# Patient Record
Sex: Female | Born: 1965 | Race: Black or African American | Hispanic: No | Smoking: Never smoker
Health system: Southern US, Community
[De-identification: ages and names within clinical notes are randomized; demographics above are authoritative.]

## PROBLEM LIST (undated history)

## (undated) DIAGNOSIS — F32A Depression, unspecified: Secondary | ICD-10-CM

## (undated) DIAGNOSIS — R3129 Other microscopic hematuria: Secondary | ICD-10-CM

## (undated) DIAGNOSIS — F329 Major depressive disorder, single episode, unspecified: Secondary | ICD-10-CM

## (undated) DIAGNOSIS — Z3142 Aftercare following sterilization reversal: Secondary | ICD-10-CM

## (undated) DIAGNOSIS — D649 Anemia, unspecified: Secondary | ICD-10-CM

## (undated) DIAGNOSIS — F419 Anxiety disorder, unspecified: Secondary | ICD-10-CM

## (undated) DIAGNOSIS — G43909 Migraine, unspecified, not intractable, without status migrainosus: Secondary | ICD-10-CM

## (undated) HISTORY — PX: TUBAL LIGATION: SHX77

## (undated) HISTORY — PX: BACK SURGERY: SHX140

## (undated) HISTORY — DX: Depression, unspecified: F32.A

## (undated) HISTORY — PX: WISDOM TOOTH EXTRACTION: SHX21

## (undated) HISTORY — PX: COLONOSCOPY: SHX174

## (undated) HISTORY — DX: Anemia, unspecified: D64.9

## (undated) HISTORY — DX: Major depressive disorder, single episode, unspecified: F32.9

## (undated) HISTORY — PX: OTHER SURGICAL HISTORY: SHX169

## (undated) HISTORY — DX: Anxiety disorder, unspecified: F41.9

---
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
## 1988-04-20 HISTORY — PX: TUBAL LIGATION: SHX77

## 2003-10-15 ENCOUNTER — Other Ambulatory Visit: Admission: RE | Admit: 2003-10-15 | Discharge: 2003-10-15 | Payer: Self-pay | Admitting: Family Medicine

## 2003-10-31 ENCOUNTER — Encounter: Admission: RE | Admit: 2003-10-31 | Discharge: 2003-10-31 | Payer: Self-pay | Admitting: Family Medicine

## 2004-03-07 ENCOUNTER — Ambulatory Visit: Payer: Self-pay | Admitting: Family Medicine

## 2004-07-17 ENCOUNTER — Ambulatory Visit: Payer: Self-pay | Admitting: Family Medicine

## 2004-08-08 ENCOUNTER — Ambulatory Visit: Payer: Self-pay | Admitting: Family Medicine

## 2004-08-22 ENCOUNTER — Ambulatory Visit: Payer: Self-pay | Admitting: Family Medicine

## 2004-12-05 ENCOUNTER — Ambulatory Visit: Payer: Self-pay | Admitting: Family Medicine

## 2004-12-09 ENCOUNTER — Other Ambulatory Visit: Admission: RE | Admit: 2004-12-09 | Discharge: 2004-12-09 | Payer: Self-pay | Admitting: Family Medicine

## 2004-12-09 ENCOUNTER — Ambulatory Visit: Payer: Self-pay | Admitting: Family Medicine

## 2004-12-19 ENCOUNTER — Encounter: Admission: RE | Admit: 2004-12-19 | Discharge: 2004-12-19 | Payer: Self-pay | Admitting: Family Medicine

## 2005-01-13 ENCOUNTER — Ambulatory Visit: Payer: Self-pay | Admitting: Family Medicine

## 2005-04-23 ENCOUNTER — Ambulatory Visit: Payer: Self-pay | Admitting: Family Medicine

## 2005-06-09 ENCOUNTER — Ambulatory Visit: Payer: Self-pay | Admitting: Family Medicine

## 2005-08-05 ENCOUNTER — Ambulatory Visit: Payer: Self-pay | Admitting: Family Medicine

## 2005-08-07 ENCOUNTER — Ambulatory Visit: Payer: Self-pay | Admitting: Family Medicine

## 2005-10-27 ENCOUNTER — Ambulatory Visit: Payer: Self-pay | Admitting: Family Medicine

## 2006-01-04 ENCOUNTER — Ambulatory Visit: Payer: Self-pay | Admitting: Family Medicine

## 2006-03-19 ENCOUNTER — Ambulatory Visit: Payer: Self-pay | Admitting: Family Medicine

## 2006-03-31 ENCOUNTER — Ambulatory Visit: Payer: Self-pay | Admitting: Family Medicine

## 2006-03-31 LAB — CONVERTED CEMR LAB
AST: 23 units/L (ref 0–37)
Alkaline Phosphatase: 67 units/L (ref 39–117)
BUN: 13 mg/dL (ref 6–23)
Basophils Absolute: 0 10*3/uL (ref 0.0–0.1)
Basophils Relative: 0.5 % (ref 0.0–1.0)
CO2: 26 meq/L (ref 19–32)
Calcium: 9.4 mg/dL (ref 8.4–10.5)
Chloride: 107 meq/L (ref 96–112)
Chol/HDL Ratio, serum: 3.5
Cholesterol: 175 mg/dL (ref 0–200)
Eosinophil percent: 3.2 % (ref 0.0–5.0)
LDL Cholesterol: 116 mg/dL — ABNORMAL HIGH (ref 0–99)
Lymphocytes Relative: 44.4 % (ref 12.0–46.0)
MCHC: 33.5 g/dL (ref 30.0–36.0)
Monocytes Absolute: 0.3 10*3/uL (ref 0.2–0.7)
Neutrophils Relative %: 45.9 % (ref 43.0–77.0)
Potassium: 3.8 meq/L (ref 3.5–5.1)
RBC: 3.91 M/uL (ref 3.87–5.11)
RDW: 12.4 % (ref 11.5–14.6)
TSH: 1.17 microintl units/mL (ref 0.35–5.50)
Total Protein: 7 g/dL (ref 6.0–8.3)

## 2006-04-20 HISTORY — PX: OTHER SURGICAL HISTORY: SHX169

## 2006-05-03 ENCOUNTER — Encounter (INDEPENDENT_AMBULATORY_CARE_PROVIDER_SITE_OTHER): Payer: Self-pay | Admitting: *Deleted

## 2006-05-03 ENCOUNTER — Ambulatory Visit: Payer: Self-pay | Admitting: Family Medicine

## 2006-05-03 ENCOUNTER — Other Ambulatory Visit: Admission: RE | Admit: 2006-05-03 | Discharge: 2006-05-03 | Payer: Self-pay | Admitting: Family Medicine

## 2006-05-10 LAB — HM MAMMOGRAPHY

## 2006-05-13 ENCOUNTER — Encounter: Admission: RE | Admit: 2006-05-13 | Discharge: 2006-05-13 | Payer: Self-pay | Admitting: Family Medicine

## 2006-06-30 ENCOUNTER — Ambulatory Visit: Payer: Self-pay | Admitting: Family Medicine

## 2006-07-28 ENCOUNTER — Ambulatory Visit: Payer: Self-pay | Admitting: Family Medicine

## 2006-10-14 ENCOUNTER — Ambulatory Visit: Payer: Self-pay | Admitting: Family Medicine

## 2006-12-31 ENCOUNTER — Encounter (INDEPENDENT_AMBULATORY_CARE_PROVIDER_SITE_OTHER): Payer: Self-pay | Admitting: *Deleted

## 2006-12-31 ENCOUNTER — Ambulatory Visit (HOSPITAL_COMMUNITY): Admission: RE | Admit: 2006-12-31 | Discharge: 2006-12-31 | Payer: Self-pay | Admitting: Obstetrics and Gynecology

## 2007-02-25 IMAGING — MG MM SCREEN MAMMOGRAM BILATERAL
5 series · 5 of 5 positions shown · non-contrast
Comparison: none

DG SCREEN MAMMOGRAM BILATERAL
Bilateral CC and MLO view(s) were taken.

DIGITAL SCREENING MAMMOGRAM WITH CAD:
There is a  dense fibroglandular pattern.  No masses or malignant type calcifications are 
identified.  Compared with prior studies.

[R CC]
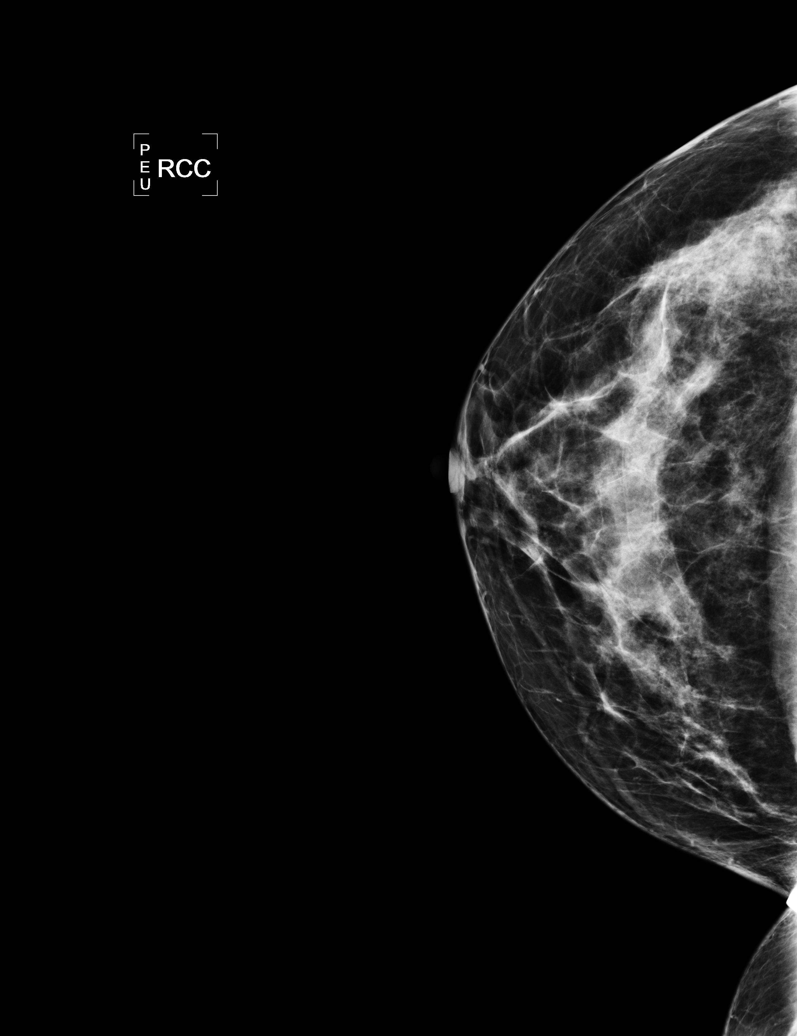

[L CC]
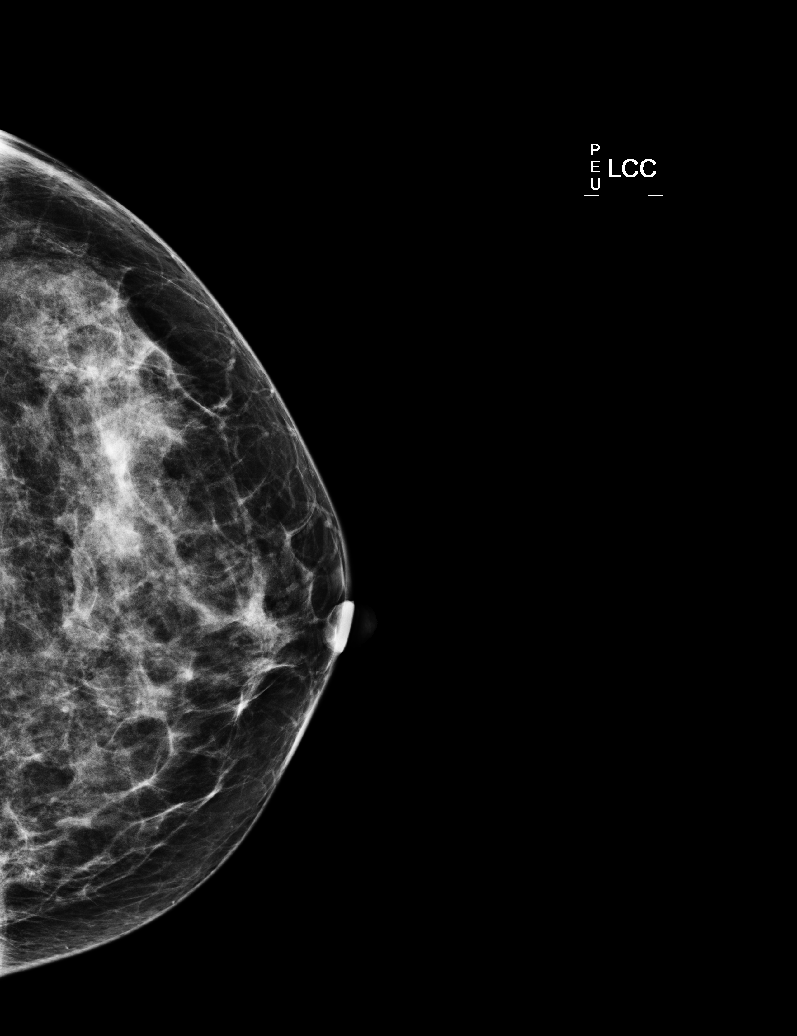

[L MLO (1 of 2)]
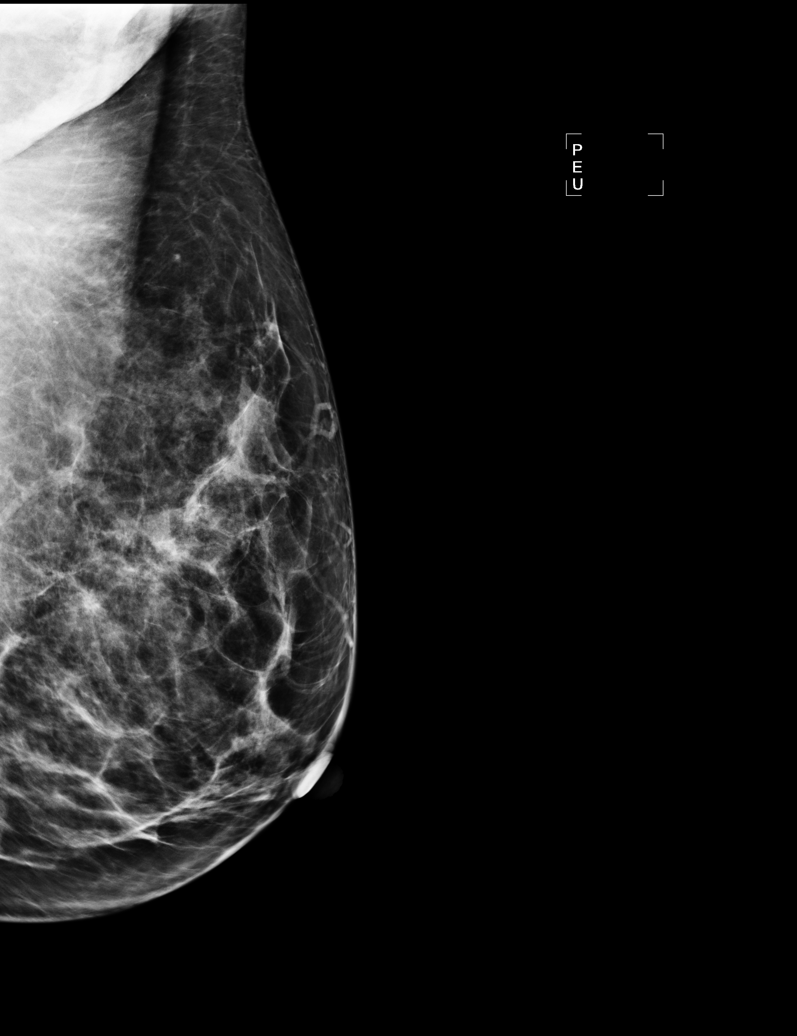

[R MLO]
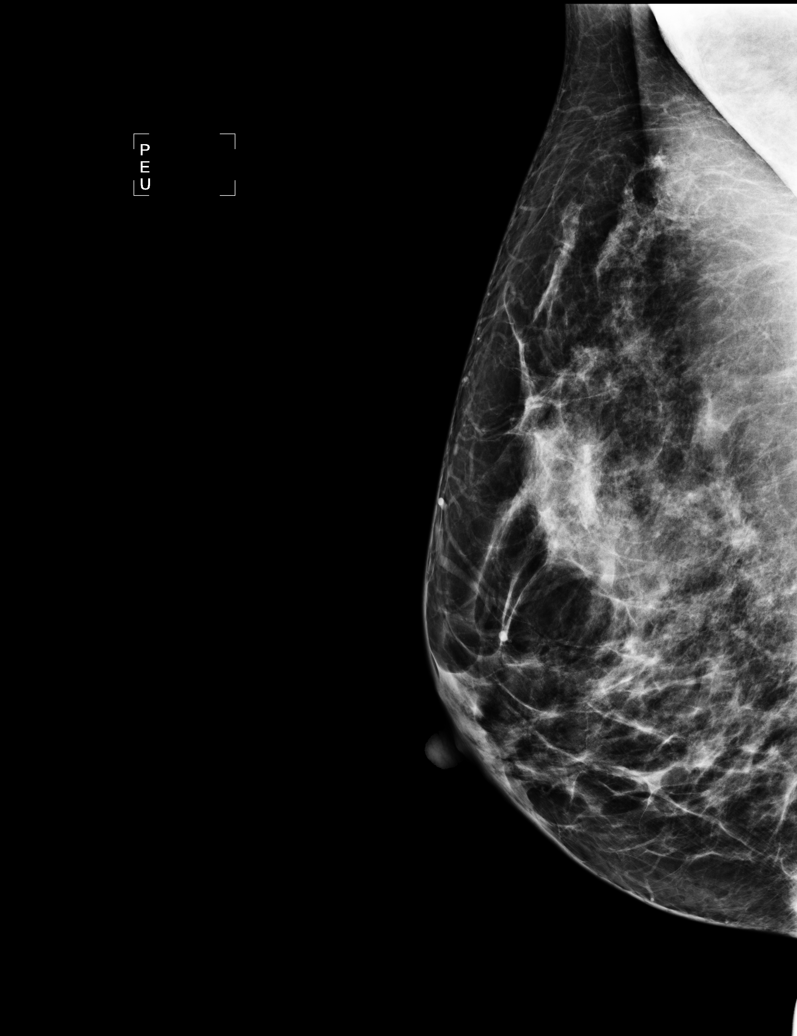

[L MLO (2 of 2)]
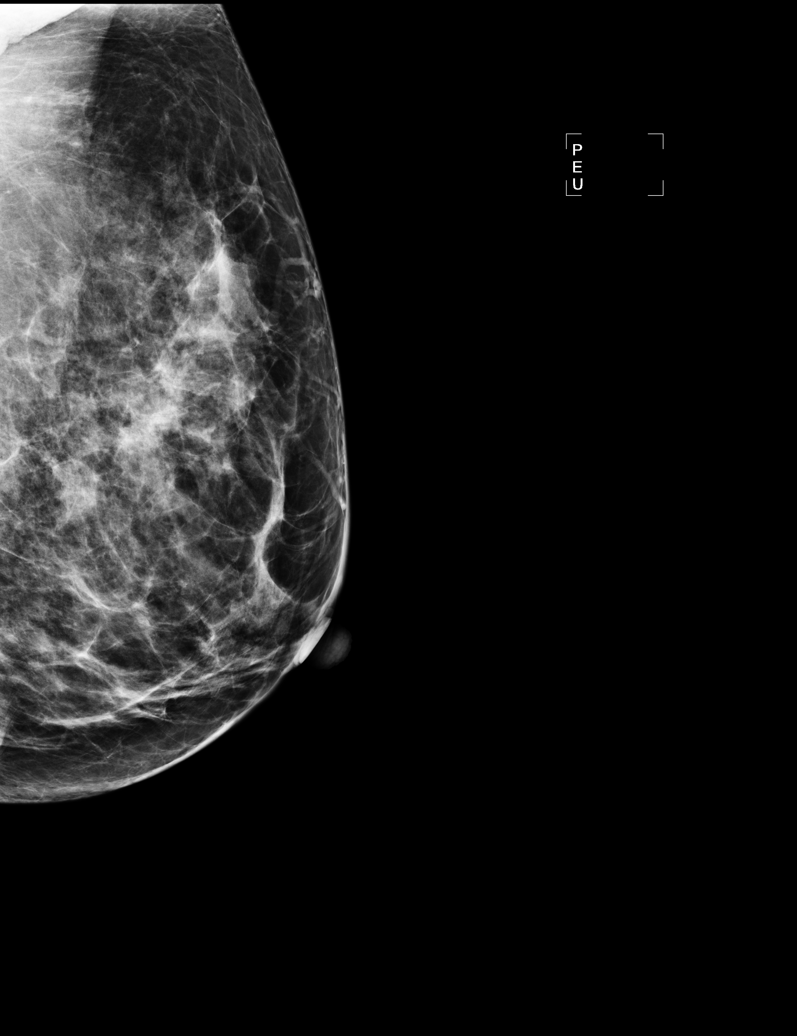

[5 of 5 positions shown; findings below may reference images not displayed]

IMPRESSION: No specific mammographic evidence of malignancy.  Next screening mammogram is recommended in one 
year.

ASSESSMENT: Negative - BI-RADS 1

Screening mammogram in 1 year.
ANALYZED BY COMPUTER AIDED DETECTION. , THIS PROCEDURE WAS A DIGITAL MAMMOGRAM.

## 2007-04-21 HISTORY — PX: OTHER SURGICAL HISTORY: SHX169

## 2007-10-17 ENCOUNTER — Ambulatory Visit (HOSPITAL_COMMUNITY): Admission: RE | Admit: 2007-10-17 | Discharge: 2007-10-17 | Payer: Self-pay | Admitting: Obstetrics and Gynecology

## 2009-03-11 ENCOUNTER — Ambulatory Visit: Payer: Self-pay | Admitting: Family Medicine

## 2009-03-11 DIAGNOSIS — J019 Acute sinusitis, unspecified: Secondary | ICD-10-CM

## 2009-09-05 ENCOUNTER — Ambulatory Visit: Payer: Self-pay | Admitting: Family Medicine

## 2009-09-05 DIAGNOSIS — H04329 Acute dacryocystitis of unspecified lacrimal passage: Secondary | ICD-10-CM | POA: Insufficient documentation

## 2009-12-31 ENCOUNTER — Ambulatory Visit: Payer: Self-pay | Admitting: Family Medicine

## 2009-12-31 DIAGNOSIS — K602 Anal fissure, unspecified: Secondary | ICD-10-CM

## 2010-01-02 ENCOUNTER — Ambulatory Visit: Payer: Self-pay | Admitting: Family Medicine

## 2010-01-02 DIAGNOSIS — R198 Other specified symptoms and signs involving the digestive system and abdomen: Secondary | ICD-10-CM | POA: Insufficient documentation

## 2010-01-08 ENCOUNTER — Telehealth: Payer: Self-pay | Admitting: Family Medicine

## 2010-01-08 ENCOUNTER — Encounter (INDEPENDENT_AMBULATORY_CARE_PROVIDER_SITE_OTHER): Payer: Self-pay | Admitting: *Deleted

## 2010-01-08 DIAGNOSIS — K6289 Other specified diseases of anus and rectum: Secondary | ICD-10-CM

## 2010-01-14 ENCOUNTER — Ambulatory Visit: Payer: Self-pay | Admitting: Internal Medicine

## 2010-01-15 ENCOUNTER — Ambulatory Visit: Payer: Self-pay | Admitting: Internal Medicine

## 2010-05-21 NOTE — Assessment & Plan Note (Signed)
Summary: SHARP RECTAL PAIN SINCE FRIDAY//SLM   Vital Signs:  Patient profile:   45 year old female Weight:      146 pounds O2 Sat:      98 % Temp:     99.2 degrees F Pulse rate:   87 / minute BP sitting:   120 / 80  (left arm) Cuff size:   regular  Vitals Entered By: Pura Spice, RN (December 31, 2009 4:01 PM) CC: strains with BM  pain rectal area has been using tucks and preparation h.    History of Present Illness: Here for several days of sharp pains around the rectum. No bleeding. Today it feels better than it did. She has been applying Preparation H to the area. BMs are regular and not painful.   Allergies (verified): No Known Drug Allergies  Past History:  Past Medical History: Reviewed history from 10/11/2006 and no changes required. Unremarkable  Past Surgical History: Reviewed history from 10/11/2006 and no changes required. Denies surgical history  Review of Systems  The patient denies anorexia, fever, weight loss, weight gain, vision loss, decreased hearing, hoarseness, chest pain, syncope, dyspnea on exertion, peripheral edema, prolonged cough, headaches, hemoptysis, abdominal pain, melena, hematochezia, severe indigestion/heartburn, hematuria, incontinence, genital sores, muscle weakness, suspicious skin lesions, transient blindness, difficulty walking, depression, unusual weight change, abnormal bleeding, enlarged lymph nodes, angioedema, breast masses, and testicular masses.    Physical Exam  General:  Well-developed,well-nourished,in no acute distress; alert,appropriate and cooperative throughout examination Rectal:  clear except for a partially healed tiny fissure on the anterior anal verge, no hemorrhoids   Impression & Recommendations:  Problem # 1:  ANAL FISSURE (ICD-565.0)  Complete Medication List: 1)  Polytrim 10000-0.1 Unit/ml-% Soln (Polymyxin b-trimethoprim) .... 2 drops od q 4 hours while awake  Patient Instructions: 1)  Increase fiber  in the diet.  2)  Please schedule a follow-up appointment as needed .

## 2010-05-21 NOTE — Assessment & Plan Note (Signed)
Summary: SORE BUMP ON R EYE // RS   Vital Signs:  Patient profile:   45 year old female Weight:      148 pounds Temp:     98.5 degrees F oral BP sitting:   90 / 62  (left arm) Cuff size:   regular  Vitals Entered By: Sid Falcon LPN (Sep 05, 2009 2:56 PM) CC: bump on right eye, sore   History of Present Illness: Noted swelling right upper lid onset this morning. She noticed some swelling along the inner canthus region. Minimal drainage. No blurred vision. No eye injury. Left eye is normal.  No fever.  Allergies (verified): No Known Drug Allergies  Review of Systems  The patient denies fever, vision loss, and headaches.    Physical Exam  General:  Well-developed,well-nourished,in no acute distress; alert,appropriate and cooperative throughout examination Head:  Normocephalic and atraumatic without obvious abnormalities. No apparent alopecia or balding. Eyes:  patient has minimal swelling left upper lid medially. No visible erythema. Minimal thick mucoid drainage near the inner canthus region. Conjunctiva appears normal. Pupils equal reactive to light. Cornea normal Ears:  External ear exam shows no significant lesions or deformities.  Otoscopic examination reveals clear canals, tympanic membranes are intact bilaterally without bulging, retraction, inflammation or discharge. Hearing is grossly normal bilaterally.   Impression & Recommendations:  Problem # 1:  ACUTE DACRYOCYSTITIS (ICD-375.32) Assessment New warm compresses and antibiotic eye drop and ophthalmology next week if no better.  Complete Medication List: 1)  Polytrim 10000-0.1 Unit/ml-% Soln (Polymyxin b-trimethoprim) .... 2 drops od q 4 hours while awake  Patient Instructions: 1)  use warm compresses several times daily 2)  Use topical antibiotic every 4 hours while awake 3)  Followup in 4-5 days if symptoms not resolving Prescriptions: POLYTRIM 10000-0.1 UNIT/ML-% SOLN (POLYMYXIN B-TRIMETHOPRIM) 2 drops OD q  4 hours while awake  #5 ml x 1   Entered and Authorized by:   Evelena Peat MD   Signed by:   Evelena Peat MD on 09/05/2009   Method used:   [facility] to        [facility] [phone]* (retail)       [address]       [place], [place]  [address]       Ph: [phone]       Fax: [phone]   RxID:   [id number]

## 2010-05-21 NOTE — Assessment & Plan Note (Addendum)
Summary: Severe rectal pain (new consult)   History of Present Illness Visit Type: Initial Consult Primary GI MD: Yancey Flemings MD Primary Provider: Gershon Crane, MD Requesting Provider: Gershon Crane, MD Chief Complaint: severe chronic rectal pain, no bleeding History of Present Illness:   45 year old African American female with no significant past medical history. She is referred here today regarding new onset rectal pain of about 3 weeks duration. Patient reports being in her usual state of good health until about 3 weeks ago when she noticed a sharp pain in the rectal area that lasted for a few seconds. This happened Elnita Maxwell times per day, and was most noticeable upon initially sitting or standing. She was seen by Dr. Clent Ridges on September 13 and rectal exam revealed a healing fissure. She was treated with topical therapy. Followup in 2 days revealed a normal rectal exam. Stool was Hemoccult negative. She was given Canasa suppositories 3 times daily. Despite the use, her problem has persisted. Reports that there was no improvement with fiber, preparation H., Tucks pads, or topical therapies/suppositories as noted. The pattern continues to be unchanged. Sharp pain lasts for a few seconds are minute. This occurs about 3 times per day. Biopsies are described as regular without blood. No family history of colon cancer. No new abdominal pain, weight loss. She does have a history of chronic intermittent pelvic pain for which she sees her gynecologist,.   GI Review of Systems    Reports abdominal pain.     Location of  Abdominal pain: lower abdomen/pelvis.    Denies acid reflux, belching, bloating, chest pain, dysphagia with liquids, dysphagia with solids, heartburn, loss of appetite, nausea, vomiting, vomiting blood, weight loss, and  weight gain.      Reports rectal pain.     Denies anal fissure, black tarry stools, change in bowel habit, constipation, diarrhea, diverticulosis, fecal incontinence, heme  positive stool, hemorrhoids, irritable bowel syndrome, jaundice, light color stool, liver problems, and  rectal bleeding.    Current Medications (verified): 1)  Polytrim 10000-0.1 Unit/ml-% Soln (Polymyxin B-Trimethoprim) .... 2 Drops Od Q 4 Hours While Awake  Allergies (verified): No Known Drug Allergies  Past History:  Past Medical History: Reviewed history from 01/02/2010 and no changes required. Unremarkable sees Dr. Marcelle Overlie for GYN exams  Past Surgical History: Denies surgical history Tubal Ligation Reversed tubal ligation 2009  Family History: Reviewed history from 03/11/2009 and no changes required. Family History Diabetes 1st degree relative  Social History: Divorced Never Smoked Alcohol use-no Drug use-no Regular exercise-no Occupation: [employer] Daily Caffeine Use  Review of Systems       The patient complains of blood in urine.  The patient denies allergy/sinus, anemia, anxiety-new, arthritis/joint pain, back pain, breast changes/lumps, change in vision, confusion, cough, coughing up blood, depression-new, fainting, fatigue, fever, headaches-new, hearing problems, heart murmur, heart rhythm changes, itching, menstrual pain, muscle pains/cramps, night sweats, nosebleeds, pregnancy symptoms, shortness of breath, skin rash, sleeping problems, sore throat, swelling of feet/legs, swollen lymph glands, thirst - excessive , urination - excessive , urination changes/pain, urine leakage, vision changes, and voice change.    Vital Signs:  Patient profile:   45 year old female Height:      63 inches Weight:      144.13 pounds BMI:     25.62 Pulse rate:   60 / minute Pulse rhythm:   regular BP sitting:   100 / 66  (left arm) Cuff size:   regular  Vitals Entered By: June McMurray  CMA Duncan Dull) (January 14, 2010 10:54 AM)  Physical Exam  General:  Well developed, well nourished, no acute distress. Head:  Normocephalic and atraumatic. Eyes:  PERRLA, no  icterus. Mouth:  No deformity or lesions, dentition normal. Neck:  Supple; no masses or thyromegaly. Lungs:  Clear throughout to auscultation. Heart:  Regular rate and rhythm; no murmurs, rubs,  or bruits. Abdomen:  Soft, nontender and nondistended. No masses, hepatosplenomegaly or hernias noted. Normal bowel sounds. Rectal:  not repeated. 2 prior rectal exams within the past 2 weeks has noted. Most recent normal Msk:  Symmetrical with no gross deformities. Normal posture. Pulses:  Normal pulses noted. Extremities:  No clubbing, cyanosis, edema or deformities noted. Neurologic:  Alert and  oriented x4;  grossly normal neurologically. Skin:  Intact without significant lesions or rashes. Psych:  Alert and cooperative. Normal mood and affect.   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 I explained to her that this is probably on the basis of spasm and at the condition is poorly understood. Recommend avoiding situations that aggravate symptoms as well as local massage were warm compresses needed. Also plan to rule out other organic processes the may stimulate the condition. See below.  Problem # 2:  SPECIAL SCREENING FOR MALIGNANT NEOPLASMS COLON (ICD-V76.51) appropriate age for index screening colonoscopy. In addition to providing colorectal neoplasia screening, we can rule out other organic processes to explain rectal discomfort. The nature of colonoscopy as well as the risks, benefits, and alternatives were reviewed. She understood and agreed to proceed. Movi prep prescribed. The patient instructed on its use  Other Orders: Colonoscopy (Colon)  Patient Instructions: 1)  Colonoscopy and Flexible Sigmoidoscopy brochure given.  2)  Pick up your prep from your pharmacy.  3)  Copy sent to : Gershon Crane, MD; Marcelle Overlie, M.D. 4)  The medication list was reviewed and reconciled.   All changed / newly prescribed medications were explained.  A complete medication list was provided to the patient / caregiver. Prescriptions: MOVIPREP 100 GM  SOLR (PEG-KCL-NACL-NASULF-NA ASC-C) As per prep instructions.  #1 x 0   Entered by:   Christie Nottingham CMA (AAMA)   Authorized by:   Hilarie Fredrickson MD   Signed by:   Christie Nottingham CMA (AAMA) on 01/14/2010   Method used:   [facility] to        [facility] [phone]* (retail)       [address]       [place], [place]  [address]       Ph: [phone]       Fax: [phone]   RxID:   [id number] MOVIPREP 100 GM  SOLR (PEG-KCL-NACL-NASULF-NA ASC-C) As per prep instructions.  #1 x 0   Entered by:   Christie Nottingham CMA (AAMA)   Authorized by:   Hilarie Fredrickson MD   Signed by:   Christie Nottingham CMA (AAMA) on 01/14/2010   Method used:   Electronically to        [facility] [phone]* (retail)       [address]       [place], [place]  [address]       Ph: [phone]       Fax: [phone]   RxID:   [id number]

## 2010-05-21 NOTE — Letter (Signed)
Summary: New Patient letter  [facility]  [address] [place], [place] [address]   Phone: [phone]  Fax: [phone]       01/08/2010 MRN: [id number]  [facility] [address] [place], [place]  [address]  Dear Ms. Blanchette,  Welcome to the Gastroenterology Division at [facility].    You are scheduled to see Dr.  Yancey Flemings on January 14, 2010 at 11:00am on the 3rd floor at [facility], [address]  We ask that you try to arrive at our office 15 minutes prior to your appointment time to allow for check-in.  We would like you to complete the enclosed self-administered evaluation form prior to your visit and bring it with you on the day of your appointment.  We will review it with you.  Also, please bring a complete list of all your medications or, if you prefer, bring the medication bottles and we will list them.  Please bring your insurance card so that we may make a copy of it.  If your insurance requires a referral to see a specialist, please bring your referral form from your primary care physician.  Co-payments are due at the time of your visit and may be paid by cash, check or credit card.     Your office visit will consist of a consult with your physician (includes a physical exam), any laboratory testing he/she may order, scheduling of any necessary diagnostic testing (e.g. x-ray, ultrasound, CT-scan), and scheduling of a procedure (e.g. Endoscopy, Colonoscopy) if required.  Please allow enough time on your schedule to allow for any/all of these possibilities.    If you cannot keep your appointment, please call [phone] to cancel or reschedule prior to your appointment date.  This allows [place] the opportunity to schedule an appointment for another patient in need of care.  If you do not cancel or reschedule by 5 p.m. the business day prior to your appointment date, you will be charged a $50.00 late cancellation/no-show fee.    Thank you for  choosing [facility] for your medical needs.  We appreciate the opportunity to care for you.  Please visit [place] at our website  to learn more about our practice.                     Sincerely,                                                             The Gastroenterology Division

## 2010-05-21 NOTE — Assessment & Plan Note (Signed)
Summary: F/U ON RECTAL PAIN, ANAL FISSURE // RS   Vital Signs:  Patient profile:   45 year old female Pulse rate:   80 / minute BP sitting:   116 / 78  Vitals Entered By: Pura Spice, RN (January 02, 2010 10:12 AM) CC: still feels like pressure in rectal area. thinks may need referring out.    History of Present Illness: Here for a recheck on intermittent sharp rectal pains that she has been having for about one week. Her BMs are normal, and it is not painful to pass a stool. No bleeding has been seen. No urinary symptoms. No fever or nausea. On our exam 2 days ago we saw what appeared to be a small healing anal fissure, but her current symptoms seem to be far beyond what we would expect from a simple fissure.   Allergies (verified): No Known Drug Allergies  Past History:  Past Medical History: Unremarkable sees Dr. Marcelle Overlie for GYN exams  Past Surgical History: Reviewed history from 10/11/2006 and no changes required. Denies surgical history  Review of Systems  The patient denies anorexia, fever, weight loss, weight gain, vision loss, decreased hearing, hoarseness, chest pain, syncope, dyspnea on exertion, peripheral edema, prolonged cough, headaches, hemoptysis, abdominal pain, melena, hematochezia, severe indigestion/heartburn, hematuria, incontinence, genital sores, muscle weakness, suspicious skin lesions, transient blindness, difficulty walking, depression, unusual weight change, abnormal bleeding, enlarged lymph nodes, angioedema, breast masses, and testicular masses.    Physical Exam  General:  Well-developed,well-nourished,in no acute distress; alert,appropriate and cooperative throughout examination Abdomen:  Bowel sounds positive,abdomen soft and non-tender without masses, organomegaly or hernias noted. Rectal:  No external abnormalities noted. Normal sphincter tone. No rectal masses or tenderness. Stool heme negative   Impression &  Recommendations:  Problem # 1:  TENESMUS (ICD-787.99)  Problem # 2:  ANAL FISSURE (ICD-565.0)  Complete Medication List: 1)  Polytrim 10000-0.1 Unit/ml-% Soln (Polymyxin b-trimethoprim) .... 2 drops od q 4 hours while awake 2)  Canasa 1000 Mg Supp (Mesalamine) .... Use three times a day per rectum as needed  Patient Instructions: 1)  She is experiencing tenesmus, possibly stemming from the recent fissure that has now healed. Try Canasa suppositories and hot tub soaks.  2)  Please schedule a follow-up appointment as needed .  Prescriptions: CANASA 1000 MG SUPP (MESALAMINE) use three times a day per rectum as needed  #30 x 2   Entered and Authorized by:   Nelwyn Salisbury MD   Signed by:   Nelwyn Salisbury MD on 01/02/2010   Method used:   Electronically to        [facility] [phone]* (retail)       [address]       [place], [place]  [address]       Ph: [phone]       Fax: [phone]   RxID:   [id number]

## 2010-05-21 NOTE — Procedures (Signed)
Summary: Colonoscopy  Patient: [name] Note: All result statuses are Final unless otherwise noted.  Tests: (1) Colonoscopy (COL)   COL Colonoscopy           DONE     [facility]     [address]     [place], [place]  [address]           COLONOSCOPY PROCEDURE REPORT           PATIENT:  Linda Mcconnell, Linda Mcconnell  MR#:  [id number]     BIRTHDATE:  1965/08/31, 44 yrs. old  GENDER:  female     ENDOSCOPIST:  Wilhemina Bonito. Eda Keys, MD     REF. BY:  Tera Mater. Clent Ridges, M.D.     PROCEDURE DATE:  01/15/2010     PROCEDURE:  Average-risk screening colonoscopy     G0121     ASA CLASS:  Class I     INDICATIONS:  screening ; rectal pain     MEDICATIONS:   Fentanyl 75 mcg IV, Versed 8 mg IV           DESCRIPTION OF PROCEDURE:   After the risks benefits and     alternatives of the procedure were thoroughly explained, informed     consent was obtained.  Digital rectal exam was performed and     revealed no abnormalities.   The LB [id number] endoscope     was introduced through the anus and advanced to the cecum, which     was identified by both the appendix and ileocecal valve, without     limitations.Time to cecum = 2:55 min. The quality of the prep was     excellent, using MoviPrep.  The instrument was then slowly     withdrawn (time = 9:23 min) as the colon was fully examined.     <<PROCEDUREIMAGES>>           FINDINGS:  A normal appearing cecum, ileocecal valve, and     appendiceal orifice were identified. The ascending, hepatic     flexure, transverse, splenic flexure, descending, sigmoid colon,     and rectum appeared unremarkable.     Retroflexed views in the     rectum revealed no abnormalities.    The scope was then withdrawn     from the patient and the procedure completed.           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           ______________________________     Wilhemina Bonito. Eda Keys, MD           CC:  Nelwyn Salisbury, MD; The Patient           n.     eSIGNED:   Wilhemina Bonito. Eda Keys at 01/15/2010 01:19 PM           Rhoderick Moody, [phone]  Note: An exclamation mark (!) indicates a result that was not dispersed into the flowsheet. Document Creation Date: 01/15/2010 1:20 PM _______________________________________________________________________  (1) Order result status: Final Collection or observation date-time: 01/15/2010 13:10 Requested date-time:  Receipt date-time:  Reported date-time:  Referring Physician:   Ordering Physician: Fransico Setters [phone]) Specimen Source:  Source: Launa Grill Order Number: [id number] Lab site:   Appended Document: Colonoscopy    Clinical Lists Changes  Observations: Added new observation of COLONNXTDUE: 12/2019 (01/15/2010 14:32)

## 2010-05-21 NOTE — Progress Notes (Signed)
Summary: referral  Phone Note Call from Patient Call back at Home Phone [phone]   Caller: vm Summary of Call: Still having chronic, sharp pain rectum on the Rx given.  Need referral to specialist as you mentioned & need it urgently. Initial call taken by: Rudy Jew, RN,  January 08, 2010 12:11 PM  Follow-up for Phone Call        refer to GI ASAP for severe rectal pain Follow-up by: Nelwyn Salisbury MD,  January 08, 2010 1:03 PM  Additional Follow-up for Phone Call Additional follow up Details #1::        sent to terri Additional Follow-up by: Pura Spice, RN,  January 08, 2010 1:45 PM  New Problems: RECTAL PAIN [phone])   New Problems: RECTAL PAIN [phone])

## 2010-05-21 NOTE — Letter (Signed)
Summary: [facility] Instructions  [facility]  [address] [place], [place] [address]   Phone: [phone]  Fax: [phone]       Linda Mcconnell    1965-07-12    MRN: [id number]        Procedure Day Dorna Bloom: Wedneday September 28th, 2011     Arrival Time: 10:30am     Procedure Time: 11:30am     Location of Procedure:                    _x _  [facility] (4th Floor)                        PREPARATION FOR COLONOSCOPY WITH MOVIPREP    THE DAY BEFORE YOUR PROCEDURE         DATE: 01/14/10  DAY: Tuesday  1.  Drink clear liquids the entire day-NO SOLID FOOD  2.  Do not drink anything colored red or purple.  Avoid juices with pulp.  No orange juice.  3.  Drink at least 64 oz. (8 glasses) of fluid/clear liquids during the day to prevent dehydration and help the prep work efficiently.  CLEAR LIQUIDS INCLUDE: Water Jello Ice Popsicles Tea (sugar ok, no milk/cream) Powdered fruit flavored drinks Coffee (sugar ok, no milk/cream) Gatorade Juice: apple, white grape, white cranberry  Lemonade Clear bullion, consomm, broth Carbonated beverages (any kind) Strained chicken noodle soup Hard Candy                             4.  In the morning, mix first dose of MoviPrep solution:    Empty 1 Pouch A and 1 Pouch B into the disposable container    Add lukewarm drinking water to the top line of the container. Mix to dissolve    Refrigerate (mixed solution should be used within 24 hrs)  5.  Begin drinking the prep at 5:00 p.m. The MoviPrep container is divided by 4 marks.   Every 15 minutes drink the solution down to the next mark (approximately 8 oz) until the full liter is complete.   6.  Follow completed prep with 16 oz of clear liquid of your choice (Nothing red or purple).  Continue to drink clear liquids until bedtime.  7.  Before going to bed, mix second dose of MoviPrep solution:    Empty 1 Pouch A and 1 Pouch B into the disposable  container    Add lukewarm drinking water to the top line of the container. Mix to dissolve    Refrigerate  THE DAY OF YOUR PROCEDURE      DATE: 01/15/10 DAY: Wednesday  Beginning at 6:30 a.m. (5 hours before procedure):         1. Every 15 minutes, drink the solution down to the next mark (approx 8 oz) until the full liter is complete.  2. Follow completed prep with 16 oz. of clear liquid of your choice.    3. You may drink clear liquids until 9:30am (2 HOURS BEFORE PROCEDURE).   MEDICATION INSTRUCTIONS  Unless otherwise instructed, you should take regular prescription medications with a small sip of water   as early as possible the morning of your procedure.       OTHER INSTRUCTIONS  You will need a responsible adult at least 45 years of age to accompany you and drive you home.   This person must remain  in the waiting room during your procedure.  Wear loose fitting clothing that is easily removed.  Leave jewelry and other valuables at home.  However, you may wish to bring a book to read or  an iPod/MP3 player to listen to music as you wait for your procedure to start.  Remove all body piercing jewelry and leave at home.  Total time from sign-in until discharge is approximately 2-3 hours.  You should go home directly after your procedure and rest.  You can resume normal activities the  day after your procedure.  The day of your procedure you should not:   Drive   Make legal decisions   Operate machinery   Drink alcohol   Return to work  You will receive specific instructions about eating, activities and medications before you leave.    The above instructions have been reviewed and explained to me by   Marchelle Folks.     I fully understand and can verbalize these instructions _____________________________ Date _________

## 2010-07-02 ENCOUNTER — Encounter: Payer: Self-pay | Admitting: Family Medicine

## 2010-07-02 ENCOUNTER — Ambulatory Visit (INDEPENDENT_AMBULATORY_CARE_PROVIDER_SITE_OTHER): Payer: 59 | Admitting: Family Medicine

## 2010-07-02 VITALS — BP 110/78 | HR 56 | Temp 98.6°F | Wt 149.0 lb

## 2010-07-02 DIAGNOSIS — M25549 Pain in joints of unspecified hand: Secondary | ICD-10-CM

## 2010-07-02 DIAGNOSIS — L0390 Cellulitis, unspecified: Secondary | ICD-10-CM

## 2010-07-02 DIAGNOSIS — M7989 Other specified soft tissue disorders: Secondary | ICD-10-CM

## 2010-07-02 MED ORDER — DOXYCYCLINE HYCLATE 100 MG PO CAPS: 100.0000 mg | ORAL_CAPSULE | Freq: Two times a day (BID) | ORAL | Status: AC

## 2010-07-02 NOTE — Progress Notes (Signed)
  Subjective:    Patient ID: Linda Mcconnell, female    DOB: 1966-03-16, 45 y.o.   MRN: [id number]  HPI Here for 10 days of swelling and pain in the right 3rd finger. She thinks she was bitten by a spider while working in her yard, since the finger started bothering her later that day. She has taken Motrin which helps. The finger was slowly getting better over the past week, but yesterday it started to swell again. No fever or nausea.    Review of Systems  Constitutional: Negative.   Musculoskeletal: Positive for joint swelling.  Skin: Positive for color change.       Objective:   Physical Exam  Constitutional: She appears well-developed and well-nourished.  Musculoskeletal:       The proximal third of the right 3rd finger is red, warm, tender, and mildly tender. Full ROM.           Assessment & Plan:  This is consistent with an arthropod bite which has gotten infected. Use Doxycycline , ice packs, and Motrin.

## 2010-09-05 NOTE — Assessment & Plan Note (Signed)
[facility] OFFICE NOTE   Linda Mcconnell, Linda Mcconnell                      MRN:          [id number]  DATE:05/03/2006                            DOB:          Oct 21, 1965    SUBJECTIVE:  This is a 45 year old woman here for a complete physical  examination.  In general she is doing reasonably well.  We have been  following her for the last several years for irregular period, heavy  periods and painful periods.  Actually from a pain perspective, they  have gotten much more manageable over the past six months or so,  although they are still somewhat irregular.  There seems to be less  bleeding involved.  She still denies any hot flashes.  She has not had a  mammogram now in several years.  She has no other particular complaints  today.   PAST MEDICAL HISTORY/FAMILY HISTORY/HABITS:  For details, I refer you to  our last physical note dated December 09, 2004.  Of note, we did a workup  for some elevated liver enzymes last year, which was negative.  This  included testing for hepatitis, HIV, etc.  All of this was fine.  We  even got an abdominal ultrasound, which was within normal limits.  On  asking her further questions about this at that time, this was during a  time when her menses were particularly painful, and she admits to taking  quite large amounts of Tylenol on a regular basis, to deal with them.  She stopped this some time ago, and in fact never takes anything for her  periods now.  She simply lies down and tries to sleep it off.  She does  not use alcohol and never has.   ALLERGIES:  No known drug allergies.   CURRENT MEDICATIONS:  None.   OBJECTIVE:  VITAL SIGNS:  Height 5 feet 4 inches, weight 144 pounds,  blood pressure 104/72, pulse 84 and regular.  GENERAL:  She appears to be healthy.  SKIN:  Clear.  HEENT:  Eyes clear.  Ears clear.  Pharynx clear.  NECK:  Supple, without lymphadenopathy or masses.  LUNGS:  Clear.  HEART:  Rate and rhythm regular, without gallops, murmurs or rubs.  Distal pulses are full.  ABDOMEN:  Soft, normal bowel sounds, nontender, no masses.  BREASTS/AXILLAE:  Clear.  PELVIC:  External genitalia within normal limits.  Vagina clear.  Cervix  clear.  Pap smear is obtained.  Uterus is not enlarged.  No masses or  tenderness.  EXTREMITIES:  No clubbing, cyanosis or edema.  NEUROLOGIC:  Grossly intact.   LABORATORY DATA:  She was here for fasting labs on March 31, 2006.  These were all within normal limits, including completely normal liver  enzymes.   ASSESSMENT/PLAN:  1. Complete physical:  I advised her to get more regular exercise.      She will also call and set up her own mammogram sometime soon.  2. Menorrhagia and dysmenorrhea, currently fairly well-controlled:      Will follow up as needed.  3. History of elevated  liver enzymes one year ago:  This was probably      due to heavy Tylenol use at the time.  They now are within normal      range, and will continue to follow them closely.     Tera Mater. Clent Ridges, MD  Electronically Signed    SAF/MedQ  DD: 05/03/2006  DT: 05/04/2006  Job #: [id number]

## 2010-10-17 ENCOUNTER — Ambulatory Visit (INDEPENDENT_AMBULATORY_CARE_PROVIDER_SITE_OTHER): Payer: 59 | Admitting: Family Medicine

## 2010-10-17 ENCOUNTER — Encounter: Payer: Self-pay | Admitting: Family Medicine

## 2010-10-17 VITALS — BP 110/76 | Temp 98.6°F | Wt 146.0 lb

## 2010-10-17 DIAGNOSIS — M542 Cervicalgia: Secondary | ICD-10-CM

## 2010-10-17 MED ORDER — HYDROCODONE-ACETAMINOPHEN 10-325 MG PO TABS: 1.0000 | ORAL_TABLET | Freq: Four times a day (QID) | ORAL | Status: AC | PRN

## 2010-10-17 MED ORDER — CYCLOBENZAPRINE HCL 10 MG PO TABS: 10.0000 mg | ORAL_TABLET | Freq: Three times a day (TID) | ORAL | Status: DC | PRN

## 2010-10-17 NOTE — Progress Notes (Signed)
  Subjective:    Patient ID: Linda Mcconnell, female    DOB: January 24, 1966, 45 y.o.   MRN: [id number]  HPI Here for injuries she sustained on 10-14-10 when she fell into a sinkhole in her yard. She twisted her neck and upper body, and now she has stiffness and pain in the back of the neck and into the middle of her back. She has some HAs also. No neurologic deficits. Using heat, Motrin, and some Vicodin she had at home. No pain or numbness in the arms or hands.    Review of Systems  Constitutional: Negative.   HENT: Positive for neck pain and neck stiffness.   Eyes: Negative.   Respiratory: Negative.   Cardiovascular: Negative.   Musculoskeletal: Positive for back pain.  Neurological: Positive for headaches.       Objective:   Physical Exam  Constitutional: She is oriented to person, place, and time. She appears well-developed and well-nourished.  HENT:  Head: Normocephalic and atraumatic.  Neck:       Tender with spasm in the posterior lower neck and the upper thoracic back  area. Very reduced ROM   Neurological: She is alert and oriented to person, place, and time. No cranial nerve deficit.          Assessment & Plan:  Wrote for pain meds and Flexeril for the muscle spasms. Use heat. Will refer for PT also

## 2010-11-19 ENCOUNTER — Ambulatory Visit: Payer: 59 | Attending: Family Medicine

## 2010-11-19 DIAGNOSIS — M542 Cervicalgia: Secondary | ICD-10-CM | POA: Insufficient documentation

## 2010-11-19 DIAGNOSIS — IMO0001 Reserved for inherently not codable concepts without codable children: Secondary | ICD-10-CM | POA: Insufficient documentation

## 2010-11-19 DIAGNOSIS — R51 Headache: Secondary | ICD-10-CM | POA: Insufficient documentation

## 2010-11-19 DIAGNOSIS — R5381 Other malaise: Secondary | ICD-10-CM | POA: Insufficient documentation

## 2010-11-21 ENCOUNTER — Ambulatory Visit: Payer: 59 | Admitting: Physical Therapy

## 2010-11-24 ENCOUNTER — Ambulatory Visit: Payer: 59

## 2010-11-26 ENCOUNTER — Ambulatory Visit: Payer: 59

## 2010-12-01 ENCOUNTER — Ambulatory Visit: Payer: 59

## 2010-12-03 ENCOUNTER — Ambulatory Visit: Payer: 59

## 2010-12-08 ENCOUNTER — Ambulatory Visit: Payer: 59

## 2010-12-10 ENCOUNTER — Encounter: Payer: 59 | Admitting: Physical Therapy

## 2010-12-15 ENCOUNTER — Ambulatory Visit: Payer: 59

## 2010-12-18 ENCOUNTER — Ambulatory Visit: Payer: 59

## 2011-01-20 ENCOUNTER — Telehealth: Payer: Self-pay | Admitting: Family Medicine

## 2011-01-20 NOTE — Telephone Encounter (Signed)
If the PT folks want her to have more therapy, they need to fax me a request so I can sign it to authorize more PT (like we did 4 weeks ago)

## 2011-01-20 NOTE — Telephone Encounter (Signed)
Pt called and said that her referral to Out Pt rehab Ctr has expired. Pt needs to get a new referral for Physical Therapy.

## 2011-01-21 ENCOUNTER — Telehealth: Payer: Self-pay | Admitting: Family Medicine

## 2011-01-21 NOTE — Telephone Encounter (Signed)
Refill request for Hydrocodon-Acetaminophn 10/325 mg take 1 po q6hrs prn. Pt last here on 10/17/10 and script last filled on 11/08/10.

## 2011-01-21 NOTE — Telephone Encounter (Signed)
Need referral for Outpatient Rehab @ [facility] renewed

## 2011-01-22 ENCOUNTER — Ambulatory Visit: Payer: 59 | Attending: Family Medicine

## 2011-01-22 DIAGNOSIS — IMO0001 Reserved for inherently not codable concepts without codable children: Secondary | ICD-10-CM | POA: Insufficient documentation

## 2011-01-22 DIAGNOSIS — R5381 Other malaise: Secondary | ICD-10-CM | POA: Insufficient documentation

## 2011-01-22 DIAGNOSIS — R51 Headache: Secondary | ICD-10-CM | POA: Insufficient documentation

## 2011-01-22 DIAGNOSIS — M542 Cervicalgia: Secondary | ICD-10-CM | POA: Insufficient documentation

## 2011-01-22 MED ORDER — HYDROCODONE-ACETAMINOPHEN 10-325 MG PO TABS: 1.0000 | ORAL_TABLET | Freq: Four times a day (QID) | ORAL | Status: DC | PRN

## 2011-01-22 NOTE — Telephone Encounter (Signed)
Pt came by office and picked up script for PT.

## 2011-01-22 NOTE — Telephone Encounter (Signed)
Script called in

## 2011-01-22 NOTE — Telephone Encounter (Signed)
Call in #[phone] with one rf 

## 2011-01-27 ENCOUNTER — Ambulatory Visit: Payer: 59

## 2011-02-03 ENCOUNTER — Ambulatory Visit: Payer: 59

## 2011-02-06 ENCOUNTER — Encounter: Payer: 59 | Admitting: Physical Therapy

## 2011-02-13 ENCOUNTER — Encounter: Payer: 59 | Admitting: Physical Therapy

## 2011-02-19 ENCOUNTER — Encounter: Payer: 59 | Admitting: Physical Therapy

## 2011-03-02 ENCOUNTER — Ambulatory Visit (INDEPENDENT_AMBULATORY_CARE_PROVIDER_SITE_OTHER): Payer: 59 | Admitting: Family Medicine

## 2011-03-02 ENCOUNTER — Encounter: Payer: Self-pay | Admitting: Family Medicine

## 2011-03-02 VITALS — BP 114/74 | HR 91 | Temp 98.8°F | Wt 147.0 lb

## 2011-03-02 DIAGNOSIS — H6980 Other specified disorders of Eustachian tube, unspecified ear: Secondary | ICD-10-CM

## 2011-03-02 NOTE — Progress Notes (Signed)
  Subjective:    Patient ID: Linda Mcconnell, female    DOB: 1966-02-11, 45 y.o.   MRN: [id number]  HPI Here for several weeks of pressure in the left ear and decreased hearing. No pain.    Review of Systems  Constitutional: Negative.   HENT: Positive for hearing loss. Negative for ear pain, congestion, postnasal drip and sinus pressure.   Eyes: Negative.   Respiratory: Negative.        Objective:   Physical Exam  Constitutional: She appears well-developed and well-nourished.  HENT:  Right Ear: External ear normal.  Left Ear: External ear normal.  Nose: Nose normal.  Mouth/Throat: Oropharynx is clear and moist. No oropharyngeal exudate.  Eyes: Conjunctivae are normal. Pupils are equal, round, and reactive to light.  Neck: No thyromegaly present.  Lymphadenopathy:    She has no cervical adenopathy.          Assessment & Plan:  Try Claritin D prn

## 2011-08-04 ENCOUNTER — Other Ambulatory Visit: Payer: Self-pay | Admitting: Obstetrics and Gynecology

## 2012-06-23 ENCOUNTER — Encounter: Payer: Self-pay | Admitting: Family Medicine

## 2012-06-23 ENCOUNTER — Ambulatory Visit (INDEPENDENT_AMBULATORY_CARE_PROVIDER_SITE_OTHER): Payer: 59 | Admitting: Family Medicine

## 2012-06-23 VITALS — BP 112/72 | HR 90 | Temp 98.3°F | Wt 141.0 lb

## 2012-06-23 DIAGNOSIS — S161XXA Strain of muscle, fascia and tendon at neck level, initial encounter: Secondary | ICD-10-CM

## 2012-06-23 MED ORDER — HYDROCODONE-ACETAMINOPHEN 10-325 MG PO TABS: 1.0000 | ORAL_TABLET | Freq: Four times a day (QID) | ORAL | Status: AC | PRN

## 2012-06-23 MED ORDER — CYCLOBENZAPRINE HCL 10 MG PO TABS: 10.0000 mg | ORAL_TABLET | Freq: Three times a day (TID) | ORAL | Status: DC | PRN
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
## 2012-06-30 ENCOUNTER — Ambulatory Visit: Payer: 59 | Attending: Family Medicine

## 2012-06-30 DIAGNOSIS — M542 Cervicalgia: Secondary | ICD-10-CM | POA: Insufficient documentation

## 2012-06-30 DIAGNOSIS — IMO0001 Reserved for inherently not codable concepts without codable children: Secondary | ICD-10-CM | POA: Insufficient documentation

## 2012-06-30 DIAGNOSIS — R5381 Other malaise: Secondary | ICD-10-CM | POA: Insufficient documentation

## 2012-07-01 ENCOUNTER — Ambulatory Visit: Payer: 59 | Admitting: Physical Therapy

## 2012-07-08 ENCOUNTER — Encounter (HOSPITAL_COMMUNITY): Payer: Self-pay

## 2012-07-08 ENCOUNTER — Telehealth: Payer: Self-pay | Admitting: Family Medicine

## 2012-07-08 ENCOUNTER — Emergency Department (HOSPITAL_COMMUNITY): Payer: 59

## 2012-07-08 ENCOUNTER — Emergency Department (HOSPITAL_COMMUNITY)
Admission: EM | Admit: 2012-07-08 | Discharge: 2012-07-08 | Disposition: A | Discharge status: Home / Self Care | Payer: 59 | Attending: Emergency Medicine | Admitting: Emergency Medicine

## 2012-07-08 DIAGNOSIS — R079 Chest pain, unspecified: Secondary | ICD-10-CM

## 2012-07-08 HISTORY — DX: Aftercare following sterilization reversal: Z31.42

## 2012-07-08 LAB — CBC WITH DIFFERENTIAL/PLATELET
Basophils Absolute: 0 10*3/uL (ref 0.0–0.1)
Basophils Relative: 1 % (ref 0–1)
Eosinophils Relative: 2 % (ref 0–5)
HCT: 35.3 % — ABNORMAL LOW (ref 36.0–46.0)
Lymphs Abs: 1.6 10*3/uL (ref 0.7–4.0)
MCH: 32.3 pg (ref 26.0–34.0)
MCHC: 34.6 g/dL (ref 30.0–36.0)
Monocytes Absolute: 0.4 10*3/uL (ref 0.1–1.0)
Neutro Abs: 2.2 10*3/uL (ref 1.7–7.7)
Neutrophils Relative %: 51 % (ref 43–77)

## 2012-07-08 LAB — BASIC METABOLIC PANEL
BUN: 12 mg/dL (ref 6–23)
Creatinine, Ser: 0.91 mg/dL (ref 0.50–1.10)
GFR calc non Af Amer: 75 mL/min — ABNORMAL LOW (ref >90)
Potassium: 3.8 mEq/L (ref 3.5–5.1)
Sodium: 135 mEq/L (ref 135–145)

## 2012-07-08 MED ORDER — GI COCKTAIL ~~LOC~~
30.0000 mL | Freq: Once | ORAL | Status: AC
  Administered 2012-07-08: 30 mL via ORAL
  Filled 2012-07-08: qty 30

## 2012-07-08 NOTE — ED Notes (Addendum)
Patient in  SR. 12 Lead completed

## 2012-07-08 NOTE — ED Notes (Signed)
Patient transported to X-ray 
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
## 2012-07-08 NOTE — Telephone Encounter (Signed)
Patient Information:  Caller Name: Linda Mcconnell  Phone: [phone]  Patient: Linda Mcconnell, Linda Mcconnell  Gender: Female  DOB: 05/14/1965  Age: 47 Years  PCP: Gershon Crane [facility])  Pregnant: No  Office Follow Up:  Does the office need to follow up with this patient?: No  Instructions For The Office: N/A   Symptoms  Reason For Call & Symptoms: Pt is calling with chest pain. Onset 1 hour ago.  Reviewed Health History In EMR: Yes  Reviewed Medications In EMR: Yes  Reviewed Allergies In EMR: Yes  Reviewed Surgeries / Procedures: Yes  Date of Onset of Symptoms: 07/08/2012 OB / GYN:  LMP: Unknown  Guideline(s) Used:  Chest Pain  Disposition Per Guideline:   Call EMS 911 Now  Reason For Disposition Reached:   Chest pain lasting longer than 5 minutes and ANY of the following:  Over 82 years old Over 62 years old and at least one cardiac risk factor (i.e., high blood pressure, diabetes, high cholesterol, obesity, smoker or strong family history of heart disease) Pain is crushing, pressure-like, or heavy  Took nitroglycerin and chest pain was not relieved History of heart disease (i.e., angina, heart attack, bypass surgery, angioplasty, CHF)  Advice Given:  N/A  Patient Will Follow Care Advice:  YES
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
## 2012-07-12 ENCOUNTER — Ambulatory Visit: Payer: 59

## 2012-07-15 ENCOUNTER — Encounter: Payer: 59 | Admitting: Physical Therapy

## 2012-07-19 ENCOUNTER — Ambulatory Visit (INDEPENDENT_AMBULATORY_CARE_PROVIDER_SITE_OTHER): Payer: 59 | Admitting: Family Medicine

## 2012-07-19 ENCOUNTER — Ambulatory Visit: Payer: 59 | Attending: Family Medicine

## 2012-07-19 ENCOUNTER — Encounter: Payer: Self-pay | Admitting: Family Medicine

## 2012-07-19 VITALS — BP 110/72 | HR 73 | Temp 98.7°F | Wt 142.0 lb

## 2012-07-19 DIAGNOSIS — IMO0001 Reserved for inherently not codable concepts without codable children: Secondary | ICD-10-CM | POA: Insufficient documentation

## 2012-07-19 DIAGNOSIS — Z5189 Encounter for other specified aftercare: Secondary | ICD-10-CM

## 2012-07-19 DIAGNOSIS — R5381 Other malaise: Secondary | ICD-10-CM | POA: Insufficient documentation

## 2012-07-19 DIAGNOSIS — S161XXD Strain of muscle, fascia and tendon at neck level, subsequent encounter: Secondary | ICD-10-CM

## 2012-07-19 DIAGNOSIS — M542 Cervicalgia: Secondary | ICD-10-CM | POA: Insufficient documentation

## 2012-07-19 MED ORDER — TRAMADOL HCL 50 MG PO TABS: ORAL_TABLET | ORAL | Status: DC
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
## 2012-07-21 ENCOUNTER — Ambulatory Visit: Payer: 59

## 2012-07-26 ENCOUNTER — Ambulatory Visit: Payer: 59

## 2012-07-28 ENCOUNTER — Ambulatory Visit: Payer: 59

## 2012-08-02 ENCOUNTER — Ambulatory Visit: Payer: 59

## 2012-08-04 ENCOUNTER — Ambulatory Visit: Payer: 59

## 2012-08-09 ENCOUNTER — Ambulatory Visit: Payer: 59

## 2012-08-11 ENCOUNTER — Ambulatory Visit: Payer: 59

## 2012-08-16 ENCOUNTER — Ambulatory Visit: Payer: 59

## 2012-08-18 ENCOUNTER — Ambulatory Visit: Payer: 59 | Attending: Family Medicine

## 2012-08-18 DIAGNOSIS — R5381 Other malaise: Secondary | ICD-10-CM | POA: Insufficient documentation

## 2012-08-18 DIAGNOSIS — M542 Cervicalgia: Secondary | ICD-10-CM | POA: Insufficient documentation

## 2012-08-18 DIAGNOSIS — IMO0001 Reserved for inherently not codable concepts without codable children: Secondary | ICD-10-CM | POA: Insufficient documentation

## 2012-08-23 ENCOUNTER — Ambulatory Visit: Payer: 59

## 2012-08-25 ENCOUNTER — Ambulatory Visit: Payer: 59

## 2012-08-30 ENCOUNTER — Ambulatory Visit: Payer: 59

## 2012-09-01 ENCOUNTER — Ambulatory Visit: Payer: 59

## 2012-09-06 ENCOUNTER — Ambulatory Visit: Payer: 59

## 2012-10-10 ENCOUNTER — Other Ambulatory Visit: Payer: Self-pay | Admitting: Obstetrics and Gynecology

## 2013-10-02 ENCOUNTER — Ambulatory Visit: Payer: 59 | Admitting: Family Medicine

## 2013-10-02 ENCOUNTER — Encounter: Payer: Self-pay | Admitting: Family Medicine

## 2013-10-02 ENCOUNTER — Ambulatory Visit (INDEPENDENT_AMBULATORY_CARE_PROVIDER_SITE_OTHER): Payer: 59 | Admitting: Family Medicine

## 2013-10-02 VITALS — BP 98/60 | HR 80 | Temp 99.0°F | Ht 63.0 in | Wt 142.2 lb

## 2013-10-02 DIAGNOSIS — S8000XA Contusion of unspecified knee, initial encounter: Secondary | ICD-10-CM
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
## 2013-10-02 NOTE — Progress Notes (Signed)
Pre visit review using our clinic review tool, if applicable. No additional management support is needed unless otherwise documented below in the visit note. 

## 2013-10-05 ENCOUNTER — Ambulatory Visit: Payer: Self-pay | Admitting: Licensed Clinical Social Worker

## 2013-12-06 ENCOUNTER — Other Ambulatory Visit: Payer: Self-pay | Admitting: Obstetrics and Gynecology

## 2013-12-07 LAB — CYTOLOGY - PAP

## 2014-01-23 ENCOUNTER — Encounter: Payer: Self-pay | Admitting: Family Medicine

## 2014-01-23 ENCOUNTER — Ambulatory Visit (INDEPENDENT_AMBULATORY_CARE_PROVIDER_SITE_OTHER): Payer: 59 | Admitting: Family Medicine

## 2014-01-23 VITALS — BP 107/67 | HR 92 | Temp 98.8°F | Ht 63.0 in | Wt 135.0 lb

## 2014-01-23 DIAGNOSIS — J069 Acute upper respiratory infection, unspecified: Secondary | ICD-10-CM

## 2014-01-23 NOTE — Progress Notes (Signed)
Pre visit review using our clinic review tool, if applicable. No additional management support is needed unless otherwise documented below in the visit note. 

## 2014-01-23 NOTE — Progress Notes (Signed)
   Subjective:    Patient ID: Linda Mcconnell, female    DOB: October 17, 1965, 48 y.o.   MRN: [id number]  HPI Here for 2 days of stuffy head, PND, ST, and hoarseness. No cough or fever.    Review of Systems  Constitutional: Negative.   HENT: Positive for congestion and postnasal drip. Negative for sinus pressure.   Eyes: Negative.   Respiratory: Negative.        Objective:   Physical Exam  Constitutional: She appears well-developed and well-nourished. No distress.  HENT:  Right Ear: External ear normal.  Left Ear: External ear normal.  Nose: Nose normal.  Mouth/Throat: Oropharynx is clear and moist.  Eyes: Conjunctivae are normal.  Pulmonary/Chest: Effort normal and breath sounds normal. No respiratory distress. She has no wheezes. She has no rales.  Her voice is hoarse   Lymphadenopathy:    She has no cervical adenopathy.          Assessment & Plan:  Rest your voice, drink fluids. Recheck prn

## 2014-03-01 ENCOUNTER — Encounter: Payer: Self-pay | Admitting: Family Medicine

## 2014-03-01 ENCOUNTER — Ambulatory Visit (INDEPENDENT_AMBULATORY_CARE_PROVIDER_SITE_OTHER): Payer: 59 | Admitting: Family Medicine

## 2014-03-01 VITALS — BP 108/69 | HR 90 | Temp 99.5°F | Ht 63.0 in | Wt 136.0 lb

## 2014-03-01 DIAGNOSIS — N3001 Acute cystitis with hematuria: Secondary | ICD-10-CM

## 2014-03-01 DIAGNOSIS — R350 Frequency of micturition: Secondary | ICD-10-CM

## 2014-03-01 LAB — POCT URINALYSIS DIPSTICK
Bilirubin, UA: NEGATIVE
Glucose, UA: NEGATIVE
KETONES UA: NEGATIVE
NITRITE UA: NEGATIVE
PH UA: 5.5
PROTEIN UA: NEGATIVE
Spec Grav, UA: 1.01
UROBILINOGEN UA: 0.2

## 2014-03-01 LAB — POCT URINE PREGNANCY: PREG TEST UR: NEGATIVE

## 2014-03-01 MED ORDER — CIPROFLOXACIN HCL 500 MG PO TABS: 500.0000 mg | ORAL_TABLET | Freq: Two times a day (BID) | ORAL | Status: DC

## 2014-03-01 NOTE — Progress Notes (Signed)
   Subjective:    Patient ID: Linda Mcconnell, female    DOB: Aug 22, 1965, 48 y.o.   MRN: [id number]  HPI Here for 2 days of urinary urgency and burning. No fever or nausea. Drinking a lot of water.    Review of Systems  Constitutional: Negative.   Genitourinary: Positive for dysuria, urgency and frequency. Negative for flank pain and pelvic pain.       Objective:   Physical Exam  Constitutional: She appears well-developed and well-nourished.  Abdominal: Soft. Bowel sounds are normal. She exhibits no distension and no mass. There is no tenderness. There is no rebound and no guarding.          Assessment & Plan:  Culture the sample

## 2014-03-01 NOTE — Progress Notes (Signed)
Pre visit review using our clinic review tool, if applicable. No additional management support is needed unless otherwise documented below in the visit note. 

## 2014-03-04 LAB — URINE CULTURE: Colony Count: >=100000

## 2014-03-09 ENCOUNTER — Telehealth: Payer: Self-pay | Admitting: Family Medicine

## 2014-03-09 NOTE — Telephone Encounter (Signed)
Advised pt you had left a vm. Pt asked about the bacteria found. Advised pt dr fry states "common bacteria".  Pt ok w/ that.  No need to cb.

## 2014-06-07 ENCOUNTER — Other Ambulatory Visit (HOSPITAL_COMMUNITY): Payer: Self-pay | Admitting: Obstetrics and Gynecology

## 2014-06-07 DIAGNOSIS — Z3141 Encounter for fertility testing: Secondary | ICD-10-CM

## 2014-06-13 ENCOUNTER — Ambulatory Visit (HOSPITAL_COMMUNITY)
Admission: RE | Admit: 2014-06-13 | Discharge: 2014-06-13 | Disposition: A | Discharge status: Home / Self Care | Payer: 59 | Source: Ambulatory Visit | Attending: Obstetrics and Gynecology | Admitting: Obstetrics and Gynecology

## 2014-06-13 ENCOUNTER — Encounter (INDEPENDENT_AMBULATORY_CARE_PROVIDER_SITE_OTHER): Payer: Self-pay

## 2014-06-13 DIAGNOSIS — N979 Female infertility, unspecified: Secondary | ICD-10-CM | POA: Diagnosis not present

## 2014-06-13 DIAGNOSIS — Z3141 Encounter for fertility testing: Secondary | ICD-10-CM

## 2014-06-13 MED ORDER — IOHEXOL 300 MG/ML  SOLN
20.0000 mL | Freq: Once | INTRAMUSCULAR | Status: AC | PRN
  Administered 2014-06-13: 20 mL

## 2014-08-19 ENCOUNTER — Emergency Department (HOSPITAL_BASED_OUTPATIENT_CLINIC_OR_DEPARTMENT_OTHER)
Admission: EM | Admit: 2014-08-19 | Discharge: 2014-08-19 | Disposition: A | Discharge status: Home / Self Care | Payer: 59 | Attending: Emergency Medicine | Admitting: Emergency Medicine

## 2014-08-19 ENCOUNTER — Emergency Department (HOSPITAL_BASED_OUTPATIENT_CLINIC_OR_DEPARTMENT_OTHER): Payer: 59

## 2014-08-19 ENCOUNTER — Encounter (HOSPITAL_BASED_OUTPATIENT_CLINIC_OR_DEPARTMENT_OTHER): Payer: Self-pay | Admitting: Emergency Medicine

## 2014-08-19 DIAGNOSIS — S61219A Laceration without foreign body of unspecified finger without damage to nail, initial encounter: Secondary | ICD-10-CM

## 2014-08-19 DIAGNOSIS — Y998 Other external cause status: Secondary | ICD-10-CM | POA: Diagnosis not present

## 2014-08-19 DIAGNOSIS — S61311A Laceration without foreign body of left index finger with damage to nail, initial encounter: Secondary | ICD-10-CM | POA: Diagnosis not present

## 2014-08-19 DIAGNOSIS — Z792 Long term (current) use of antibiotics: Secondary | ICD-10-CM | POA: Insufficient documentation

## 2014-08-19 DIAGNOSIS — W260XXA Contact with knife, initial encounter: Secondary | ICD-10-CM | POA: Diagnosis not present

## 2014-08-19 DIAGNOSIS — Y9389 Activity, other specified: Secondary | ICD-10-CM | POA: Diagnosis not present

## 2014-08-19 DIAGNOSIS — Y9289 Other specified places as the place of occurrence of the external cause: Secondary | ICD-10-CM | POA: Diagnosis not present

## 2014-08-19 DIAGNOSIS — Z79899 Other long term (current) drug therapy: Secondary | ICD-10-CM | POA: Diagnosis not present

## 2014-08-19 DIAGNOSIS — S6992XA Unspecified injury of left wrist, hand and finger(s), initial encounter: Secondary | ICD-10-CM | POA: Diagnosis present

## 2014-08-19 MED ORDER — LIDOCAINE HCL (PF) 2 % IJ SOLN: 10.0000 mL | Freq: Once | INTRAMUSCULAR | Status: DC

## 2014-08-19 MED ORDER — LIDOCAINE HCL (PF) 1 % IJ SOLN
INTRAMUSCULAR | Status: AC
  Filled 2014-08-19: qty 5

## 2014-08-19 NOTE — ED Notes (Signed)
Patient reports injury to left index finger after using chef's knife. Reports "i almost cut the tip of my finger off"

## 2014-08-19 NOTE — ED Provider Notes (Signed)
CSN: [id number]     Arrival date & time 08/19/14  1355 History   First MD Initiated Contact with Patient 08/19/14 1423     Chief Complaint  Patient presents with  . Finger Injury     (Consider location/radiation/quality/duration/timing/severity/associated sxs/prior Treatment) HPI Comments: Patient presents with complaint of left index finger laceration sustained just prior to arrival. Patient was using a chef's knife cutting cabbage when she cut her finger tip. She immediately ran under cold water and applied pressure. No numbness or tingling. No other injuries. Patient last tetanus shot was in 2012. The onset of this condition was acute. The course is constant. Aggravating factors: none. Alleviating factors: none.    The history is provided by the patient.    Past Medical History  Diagnosis Date  . Tubal reversal surgical follow up    Past Surgical History  Procedure Laterality Date  . Tubal ligation    . Reversed tubal ligation  2009  . Lypoma     Family History  Problem Relation Age of Onset  . Diabetes      family hx   History  Substance Use Topics  . Smoking status: Never Smoker   . Smokeless tobacco: Never Used  . Alcohol Use: 0.0 oz/week    0 Standard drinks or equivalent per week     Comment: "occassionally"   OB History    No data available     Review of Systems  Constitutional: Negative for activity change.  Musculoskeletal: Positive for myalgias. Negative for back pain, joint swelling, arthralgias and neck pain.  Skin: Positive for wound.  Neurological: Negative for weakness and numbness.    Allergies  Review of patient's allergies indicates no known allergies.  Home Medications   Prior to Admission medications   Medication Sig Start Date End Date Taking? Authorizing Provider  ciprofloxacin (CIPRO) 500 MG tablet Take 1 tablet (500 mg total) by mouth 2 (two) times daily. 03/01/14   Laurey Morale, MD  desvenlafaxine (PRISTIQ) 100 MG 24 hr tablet Take  100 mg by mouth daily.    Historical Provider, MD  ibuprofen (ADVIL,MOTRIN) 200 MG tablet Take 600 mg by mouth every 4 (four) hours as needed for pain.    Historical Provider, MD  traMADol (ULTRAM) 50 MG tablet Take 1 or 2 tablets every 6 hours as needed for pain 07/19/12   Laurey Morale, MD   BP 128/72 mmHg  Pulse 90  Temp(Src) 98.3 F (36.8 C) (Oral)  Resp 16  Ht 5\' 3"  (1.6 m)  Wt 135 lb (61.236 kg)  BMI 23.92 kg/m2  SpO2 100%  LMP 07/29/2014 (Exact Date)   Physical Exam  Constitutional: She appears well-developed and well-nourished.  HENT:  Head: Normocephalic and atraumatic.  Eyes: Pupils are equal, round, and reactive to light.  Neck: Normal range of motion. Neck supple.  Cardiovascular: Exam reveals no decreased pulses.   Musculoskeletal: She exhibits tenderness. She exhibits no edema.       Left wrist: Normal.       Left hand: She exhibits laceration.       Hands: Flexion of left index finger is intact. Normal strength against resistance.  Neurological: She is alert. No sensory deficit.  Motor, sensation, and vascular distal to the injury is fully intact.   Skin: Skin is warm and dry.  Psychiatric: She has a normal mood and affect.  Nursing note and vitals reviewed.   ED Course  Procedures (including critical care time) Labs Review Labs  Reviewed - No data to display  Imaging Review Dg Finger Index Left  08/19/2014   CLINICAL DATA:  Laceration with kitchen knife  EXAM: LEFT SECOND FINGER 2+V  COMPARISON:  None.  FINDINGS: Frontal, oblique, and lateral views were obtained. There is a laceration involving the distal aspect of the second digit. No fracture or dislocation. Joint spaces appear intact. No erosive change. No radiopaque foreign body.  IMPRESSION: Laceration distal aspect second digit. No radiopaque foreign body. No bony abnormality.   Electronically Signed   By: Lowella Grip III M.D.   On: 08/19/2014 14:49     EKG Interpretation None       2:31 PM  Patient seen and examined. X-ray ordered. Will need sutured. Will perform digital block.    Vital signs reviewed and are as follows: BP 128/72 mmHg  Pulse 90  Temp(Src) 98.3 F (36.8 C) (Oral)  Resp 16  Ht 5\' 3"  (1.6 m)  Wt 135 lb (61.236 kg)  BMI 23.92 kg/m2  SpO2 100%  LMP 07/29/2014 (Exact Date)  3:17 PM Digital block performed Technique: single injection into base of finger into flexor tendon sheath Finger: L index Area prepped with iodine Medication: 62mL of 2% lidocaine without epinephrine Patient tolerated procedure well. Adequate anesthesia achieved.   LACERATION REPAIR Performed by: Faustino Congress Authorized by: Faustino Congress Consent: Verbal consent obtained. Risks and benefits: risks, benefits and alternatives were discussed Consent given by: patient Patient identity confirmed: provided demographic data Prepped and Draped in normal sterile fashion Wound explored  Laceration Location: L index, tip  Laceration Length: 1cm  No Foreign Bodies seen or palpated  Anesthesia: digital block as above  Amount of cleaning: standard, gauze and saline  Skin closure: 5-0 Ethilon  Number of sutures: 2  Technique: simple interrupted  Patient tolerance: Patient tolerated the procedure well with no immediate complications.  3:37 PM Patient counseled on wound care. Patient counseled on need to return or see PCP/urgent care for suture removal in 7 days. Patient was urged to return to the Emergency Department urgently with worsening pain, swelling, expanding erythema especially if it streaks away from the affected area, fever, or if they have any other concerns. Patient verbalized understanding.  Finger splint placed prior to discharge.    MDM   Final diagnoses:  Finger laceration, initial encounter   Patient with fingertip laceration. Appears clean. No concern for foreign body. Imaging is negative for open fracture or foreign body. Patient has good range of motion  of finger. Do not suspect flexor tendon injury. Splint placed. Patient to follow-up with her PCP as needed.   Carlisle Cater, PA-C 08/19/14 1541  Serita Grit, MD 08/21/14 [phone]

## 2014-08-19 NOTE — Discharge Instructions (Signed)
Please read and follow all provided instructions.  Your diagnoses today include:  1. Finger laceration, initial encounter    Tests performed today include:  X-ray of the affected area that did not show any foreign bodies or broken bones  Vital signs. See below for your results today.   Medications prescribed:   Naproxen - anti-inflammatory pain medication  Do not exceed 500mg  naproxen every 12 hours, take with food  You have been prescribed an anti-inflammatory medication or NSAID. Take with food. Take smallest effective dose for the shortest duration needed for your pain. Stop taking if you experience stomach pain or vomiting.   Take any prescribed medications only as directed.   Home care instructions:  Follow any educational materials and wound care instructions contained in this packet.   Keep affected area above the level of your heart when possible to minimize swelling. Wash area gently twice a day with warm soapy water. Do not apply alcohol or hydrogen peroxide. Cover the area if it draining or weeping.   Follow-up instructions: Suture Removal: Return to the Emergency Department or see your primary care care doctor in 7days for a recheck of your wound and removal of your sutures or staples.    Return instructions:  Return to the Emergency Department if you have:  Fever  Worsening pain  Worsening swelling of the wound  Pus draining from the wound  Redness of the skin that moves away from the wound, especially if it streaks away from the affected area   Any other emergent concerns  Your vital signs today were: BP 128/72 mmHg   Pulse 90   Temp(Src) 98.3 F (36.8 C) (Oral)   Resp 16   Ht 5\' 3"  (1.6 m)   Wt 135 lb (61.236 kg)   BMI 23.92 kg/m2   SpO2 100%   LMP 07/29/2014 (Exact Date) If your blood pressure (BP) was elevated above 135/85 this visit, please have this repeated by your doctor within one month. --------------

## 2014-08-28 ENCOUNTER — Encounter: Payer: Self-pay | Admitting: Adult Health

## 2014-08-28 ENCOUNTER — Ambulatory Visit (INDEPENDENT_AMBULATORY_CARE_PROVIDER_SITE_OTHER): Payer: 59 | Admitting: Adult Health

## 2014-08-28 VITALS — BP 114/74 | Temp 98.4°F | Ht 63.0 in | Wt 145.0 lb

## 2014-08-28 DIAGNOSIS — Z4802 Encounter for removal of sutures: Secondary | ICD-10-CM | POA: Diagnosis not present

## 2014-08-28 NOTE — Progress Notes (Signed)
Pre visit review using our clinic review tool, if applicable. No additional management support is needed unless otherwise documented below in the visit note. 

## 2014-08-28 NOTE — Progress Notes (Signed)
   Subjective:    Patient ID: Linda Mcconnell, female    DOB: [birth year], 49 y.o.   MRN: [id number]  HPI  Linda Mcconnell is here for suture removal. She had her sutures placed on 08/19/2014 in the ER after cutting her left index finger while cutting cabbage.   Review of Systems  All other systems reviewed and are negative.      Objective:   Physical Exam  Constitutional: She is oriented to person, place, and time. She appears well-developed and well-nourished. No distress.  Neurological: She is alert and oriented to person, place, and time.  Skin: Skin is warm and dry. No rash noted. She is not diaphoretic. No erythema. No pallor.  Wound clean/ dry and well healed  Vitals reviewed.         Assessment & Plan:  1. Visit for suture removal - Continue to monitor for signs of infection

## 2014-08-28 NOTE — Patient Instructions (Addendum)
Continue to watch for signs of infection such as redness, swelling, pain, and/or pus like discharge. If you have any of these symptoms, please return to the office. Keep the area clean and dry.   Suture Removal, Care After Refer to this sheet in the next few weeks. These instructions provide you with information on caring for yourself after your procedure. Your health care provider may also give you more specific instructions. Your treatment has been planned according to current medical practices, but problems sometimes occur. Call your health care provider if you have any problems or questions after your procedure. WHAT TO EXPECT AFTER THE PROCEDURE After your stitches (sutures) are removed, it is typical to have the following:  Some discomfort and swelling in the wound area.  Slight redness in the area. HOME CARE INSTRUCTIONS   If you have skin adhesive strips over the wound area, do not take the strips off. They will fall off on their own in a few days. If the strips remain in place after 14 days, you may remove them.  Change any bandages (dressings) at least once a day or as directed by your health care provider. If the bandage sticks, soak it off with warm, soapy water.  Apply cream or ointment only as directed by your health care provider. If using cream or ointment, wash the area with soap and water 2 times a day to remove all the cream or ointment. Rinse off the soap and pat the area dry with a clean towel.  Keep the wound area dry and clean. If the bandage becomes wet or dirty, or if it develops a bad smell, change it as soon as possible.  Continue to protect the wound from injury.  Use sunscreen when out in the sun. New scars become sunburned easily. SEEK MEDICAL CARE IF:  You have increasing redness, swelling, or pain in the wound.  You see pus coming from the wound.  You have a fever.  You notice a bad smell coming from the wound or dressing.  Your wound breaks open  (edges not staying together). Document Released: 12/30/2000 Document Revised: 01/25/2013 Document Reviewed: 11/16/2012 [facility] Patient Information 2015 [place], [place]. This information is not intended to replace advice given to you by your health care provider. Make sure you discuss any questions you have with your health care provider.

## 2015-01-28 ENCOUNTER — Encounter (HOSPITAL_BASED_OUTPATIENT_CLINIC_OR_DEPARTMENT_OTHER): Payer: Self-pay

## 2015-01-28 ENCOUNTER — Emergency Department (HOSPITAL_BASED_OUTPATIENT_CLINIC_OR_DEPARTMENT_OTHER)
Admission: EM | Admit: 2015-01-28 | Discharge: 2015-01-28 | Disposition: A | Discharge status: Home / Self Care | Payer: 59 | Attending: Emergency Medicine | Admitting: Emergency Medicine

## 2015-01-28 DIAGNOSIS — Z79899 Other long term (current) drug therapy: Secondary | ICD-10-CM | POA: Insufficient documentation

## 2015-01-28 DIAGNOSIS — R51 Headache: Secondary | ICD-10-CM | POA: Diagnosis present

## 2015-01-28 DIAGNOSIS — G43909 Migraine, unspecified, not intractable, without status migrainosus: Secondary | ICD-10-CM | POA: Diagnosis not present

## 2015-01-28 DIAGNOSIS — G43009 Migraine without aura, not intractable, without status migrainosus: Secondary | ICD-10-CM

## 2015-01-28 HISTORY — DX: Migraine, unspecified, not intractable, without status migrainosus: G43.909

## 2015-01-28 MED ORDER — DEXAMETHASONE SODIUM PHOSPHATE 10 MG/ML IJ SOLN
10.0000 mg | Freq: Once | INTRAMUSCULAR | Status: AC
  Administered 2015-01-28: 10 mg via INTRAVENOUS
  Filled 2015-01-28: qty 1

## 2015-01-28 MED ORDER — DIPHENHYDRAMINE HCL 50 MG/ML IJ SOLN
50.0000 mg | Freq: Once | INTRAMUSCULAR | Status: AC
  Administered 2015-01-28: 50 mg via INTRAVENOUS
  Filled 2015-01-28: qty 1

## 2015-01-28 MED ORDER — BUTALBITAL-APAP-CAFFEINE 50-325-40 MG PO TABS: 1.0000 | ORAL_TABLET | Freq: Four times a day (QID) | ORAL | Status: DC | PRN

## 2015-01-28 MED ORDER — SODIUM CHLORIDE 0.9 % IV BOLUS (SEPSIS)
1000.0000 mL | Freq: Once | INTRAVENOUS | Status: AC
  Administered 2015-01-28: 1000 mL via INTRAVENOUS

## 2015-01-28 MED ORDER — METOCLOPRAMIDE HCL 5 MG/ML IJ SOLN
10.0000 mg | Freq: Once | INTRAMUSCULAR | Status: AC
  Administered 2015-01-28: 10 mg via INTRAVENOUS
  Filled 2015-01-28: qty 2

## 2015-01-28 MED ORDER — KETOROLAC TROMETHAMINE 30 MG/ML IJ SOLN
30.0000 mg | Freq: Once | INTRAMUSCULAR | Status: AC
  Administered 2015-01-28: 30 mg via INTRAVENOUS
  Filled 2015-01-28: qty 1

## 2015-01-28 MED ORDER — PROMETHAZINE HCL 25 MG PO TABS: 25.0000 mg | ORAL_TABLET | Freq: Four times a day (QID) | ORAL | Status: DC | PRN

## 2015-01-28 NOTE — Discharge Instructions (Signed)

## 2015-01-28 NOTE — ED Notes (Signed)
Attempted two IV sticks without success 

## 2015-01-28 NOTE — ED Provider Notes (Signed)
TIME SEEN: 3:20 AM  CHIEF COMPLAINT: Migraine headache  HPI: Pt is a 49 y.o. female with history of migraines who presents to the emergency department with right throbbing headache that started at 12:30 AM. Feels similar to her prior migraines. No head injury. Not on anticoagulation. Does have nausea, photophobia and phonophobia. No numbness, tingling or focal weakness. No fever, neck pain or neck stiffness. States she has never had to come to the hospital before for her migraines. States she took Excedrin Migraine at home with little relief. Denies that this was a sudden onset, thunderclap, worst headache of her life. Pain was gradual in nature.  ROS: See HPI Constitutional: no fever  Eyes: no drainage  ENT: no runny nose   Cardiovascular:  no chest pain  Resp: no SOB  GI: no vomiting GU: no dysuria Integumentary: no rash  Allergy: no hives  Musculoskeletal: no leg swelling  Neurological: no slurred speech ROS otherwise negative  PAST MEDICAL HISTORY/PAST SURGICAL HISTORY:  Past Medical History  Diagnosis Date  . Tubal reversal surgical follow up   . Migraine     MEDICATIONS:  Prior to Admission medications   Medication Sig Start Date End Date Taking? Authorizing Provider  desvenlafaxine (PRISTIQ) 100 MG 24 hr tablet Take 100 mg by mouth daily.    Historical Provider, MD  ibuprofen (ADVIL,MOTRIN) 200 MG tablet Take 600 mg by mouth every 4 (four) hours as needed for pain.    Historical Provider, MD  traMADol Veatrice Bourbon) 50 MG tablet Take 1 or 2 tablets every 6 hours as needed for pain 07/19/12   Laurey Morale, MD    ALLERGIES:  No Known Allergies  SOCIAL HISTORY:  Social History  Substance Use Topics  . Smoking status: Never Smoker   . Smokeless tobacco: Never Used  . Alcohol Use: 0.0 oz/week    0 Standard drinks or equivalent per week     Comment: "occassionally"    FAMILY HISTORY: Family History  Problem Relation Age of Onset  . Diabetes      family hx    EXAM: BP  135/81 mmHg  Pulse 77  Temp(Src) 97.4 F (36.3 C) (Oral)  Resp 18  Ht 5\' 3"  (1.6 m)  Wt 140 lb (63.504 kg)  BMI 24.81 kg/m2  SpO2 100%  LMP 01/26/2015 (Exact Date) CONSTITUTIONAL: Alert and oriented and responds appropriately to questions. Well-appearing; well-nourished HEAD: Normocephalic EYES: Conjunctivae clear, PERRL ENT: normal nose; no rhinorrhea; moist mucous membranes; pharynx without lesions noted NECK: Supple, no meningismus, no LAD  CARD: RRR; S1 and S2 appreciated; no murmurs, no clicks, no rubs, no gallops RESP: Normal chest excursion without splinting or tachypnea; breath sounds clear and equal bilaterally; no wheezes, no rhonchi, no rales, no hypoxia or respiratory distress, speaking full sentences ABD/GI: Normal bowel sounds; non-distended; soft, non-tender, no rebound, no guarding, no peritoneal signs BACK:  The back appears normal and is non-tender to palpation, there is no CVA tenderness EXT: Normal ROM in all joints; non-tender to palpation; no edema; normal capillary refill; no cyanosis, no calf tenderness or swelling    SKIN: Normal color for age and race; warm NEURO: Moves all extremities equally, sensation to light touch intact diffusely, cranial nerves II through XII intact PSYCH: The patient's mood and manner are appropriate. Grooming and personal hygiene are appropriate.  MEDICAL DECISION MAKING: Patient here with what appears to be a migraine headache. Has had similar headaches in the past. Does not describe this headache as the most severe,  thunderclap or sudden onset. Doubt subarachnoid hemorrhage. No fever or meningismus on exam. No neurologic deficit. I do not feel she needs emergent head imaging. Will treat with IV fluids, Toradol, Reglan, Benadryl, Decadron and reassess. Her son is with her at bedside to drive her home.  ED PROGRESS: 5:00 AM  Pt's headache has completely resolved. We'll discharge patient home with outpatient neurology follow-up. We'll  discharge with prescription for Fioricet, Phenergan take as needed. Discussed return precautions. She verbalized understanding is comfortable with plan.     [place], DO 01/28/15 [phone]

## 2015-01-28 NOTE — ED Notes (Signed)
Right side head pain since 0030 tonight, nausea and sensitivity to light and sound, took excedrin migraine without relief

## 2015-01-28 NOTE — ED Notes (Signed)
Pt verbalizes understanding of d/c instructions and denies any further need at this time. 

## 2015-02-12 ENCOUNTER — Encounter (HOSPITAL_BASED_OUTPATIENT_CLINIC_OR_DEPARTMENT_OTHER): Payer: Self-pay | Admitting: *Deleted

## 2015-02-12 ENCOUNTER — Emergency Department (HOSPITAL_BASED_OUTPATIENT_CLINIC_OR_DEPARTMENT_OTHER)
Admission: EM | Admit: 2015-02-12 | Discharge: 2015-02-12 | Disposition: A | Discharge status: Home / Self Care | Payer: 59 | Attending: Emergency Medicine | Admitting: Emergency Medicine

## 2015-02-12 DIAGNOSIS — R42 Dizziness and giddiness: Secondary | ICD-10-CM | POA: Diagnosis not present

## 2015-02-12 DIAGNOSIS — Z79899 Other long term (current) drug therapy: Secondary | ICD-10-CM | POA: Insufficient documentation

## 2015-02-12 DIAGNOSIS — L509 Urticaria, unspecified: Secondary | ICD-10-CM | POA: Diagnosis not present

## 2015-02-12 DIAGNOSIS — Z8679 Personal history of other diseases of the circulatory system: Secondary | ICD-10-CM | POA: Insufficient documentation

## 2015-02-12 DIAGNOSIS — R21 Rash and other nonspecific skin eruption: Secondary | ICD-10-CM | POA: Diagnosis present

## 2015-02-12 MED ORDER — PREDNISONE 20 MG PO TABS: ORAL_TABLET | ORAL | Status: DC

## 2015-02-12 MED ORDER — HYDROXYZINE HCL 25 MG PO TABS: 25.0000 mg | ORAL_TABLET | Freq: Four times a day (QID) | ORAL | Status: DC | PRN

## 2015-02-12 NOTE — ED Notes (Signed)
Hives and itching since being treated for a migraine headache 2 weeks ago.

## 2015-02-12 NOTE — ED Notes (Signed)
Patient verbalizes understanding of discharge medications and instruction.  Patient AAO X 3 and stable.

## 2015-02-12 NOTE — ED Notes (Signed)
Patient states she started having a rash the night after being here on the Oct. 10th for a migraine.  She took benadryl for 3 days after with no help, so she states she started drinking water to help, but the itching and rash has gotten worse.

## 2015-02-12 NOTE — ED Provider Notes (Signed)
CSN: [id number]     Arrival date & time 02/12/15  1739 History  By signing my name below, I, Linda Mcconnell, attest that this documentation has been prepared under the direction and in the presence of Merrily Pew, MD.  Electronically Signed: Tula Mcconnell, ED Scribe. 02/12/2015. 6:15 PM.   Chief Complaint  Patient presents with  . Urticaria   The history is provided by the patient. No language interpreter was used.    HPI Comments: Linda Mcconnell is a 49 y.o. female who presents to the Emergency Department complaining of constant, moderate, itching rash on her face, scalp, abdomen, back, chest, bilateral and left shoulder that started 2 weeks ago. She states intermittent, mild dizziness as an associated symptom. Her itchiness becomes worse outside. Pt tried Benadryl for 3 days following onset of rash with no relief. She has also tried cortisone cream with some relief. Pt reports onset of symptoms started the day after she was seen in the ED for a migraine headache. She was given a migraine cocktail at her visit and was prescribed Fioricet and Phenergan. Pt denies recent sick contact or travel. She also denies fever, nausea, vomiting, diarrhea, constipation, difficulty breathing and syncope as associated symptoms.  Past Medical History  Diagnosis Date  . Tubal reversal surgical follow up   . Migraine    Past Surgical History  Procedure Laterality Date  . Tubal ligation    . Reversed tubal ligation  2009  . Lypoma     Family History  Problem Relation Age of Onset  . Diabetes      family hx   Social History  Substance Use Topics  . Smoking status: Never Smoker   . Smokeless tobacco: Never Used  . Alcohol Use: 0.0 oz/week    0 Standard drinks or equivalent per week     Comment: "occassionally"   OB History    No data available     Review of Systems  Constitutional: Negative for fever.  Respiratory: Negative for shortness of breath.   Gastrointestinal: Negative for  nausea, vomiting, diarrhea and constipation.  Skin: Positive for rash.  Neurological: Positive for dizziness.  All other systems reviewed and are negative.  Allergies  Review of patient's allergies indicates no known allergies.  Home Medications   Prior to Admission medications   Medication Sig Start Date End Date Taking? Authorizing Provider  butalbital-acetaminophen-caffeine (FIORICET) 50-325-40 MG tablet Take 1-2 tablets by mouth every 6 (six) hours as needed for headache. 01/28/15 01/28/16  Kristen N Ward, DO  desvenlafaxine (PRISTIQ) 100 MG 24 hr tablet Take 100 mg by mouth daily.    Historical Provider, MD  hydrOXYzine (ATARAX/VISTARIL) 25 MG tablet Take 1 tablet (25 mg total) by mouth every 6 (six) hours as needed for itching (do not take within 4 hours of benadryl). 02/12/15   Merrily Pew, MD  ibuprofen (ADVIL,MOTRIN) 200 MG tablet Take 600 mg by mouth every 4 (four) hours as needed for pain.    Historical Provider, MD  predniSONE (DELTASONE) 20 MG tablet 3 tabs po daily x 3 days, then 2 tabs x 3 days, then 1.5 tabs x 3 days, then 1 tab x 3 days, then 0.5 tabs x 3 days 02/12/15   Merrily Pew, MD  promethazine (PHENERGAN) 25 MG tablet Take 1 tablet (25 mg total) by mouth every 6 (six) hours as needed for nausea or vomiting. 01/28/15   Kristen N Ward, DO  traMADol (ULTRAM) 50 MG tablet Take 1 or 2 tablets every  6 hours as needed for pain 07/19/12   Laurey Morale, MD   BP 112/81 mmHg  Pulse 72  Temp(Src) 98.3 F (36.8 C) (Oral)  Resp 18  SpO2 100%  LMP 01/26/2015 (Exact Date) Physical Exam  Constitutional: She appears well-developed and well-nourished. No distress.  HENT:  Head: Normocephalic and atraumatic.  Eyes: Conjunctivae and EOM are normal.  Neck: Neck supple. No tracheal deviation present.  Cardiovascular: Normal rate, regular rhythm and normal heart sounds.   Pulmonary/Chest: Effort normal and breath sounds normal. No respiratory distress. She has no wheezes. She has  no rales.  Abdominal: Soft. There is no tenderness.  Skin: Skin is warm and dry. Rash noted.  Papular rash worst over left shoulder, but involving arms, abdomen, chest, middle back, face and neck  Psychiatric: She has a normal mood and affect. Her behavior is normal.  Nursing note and vitals reviewed.   ED Course  Procedures   DIAGNOSTIC STUDIES: Oxygen Saturation is 100% on RA, normal by my interpretation.    COORDINATION OF CARE: 6:14 PM Discussed treatment plan with pt which includes prednisone taper and Benadryl, as needed. Will prescribe Atarax, if needed. Will refer to dermatology for follow-up. Pt agreed to plan.  Labs Review Labs Reviewed - No data to display  Imaging Review No results found.   EKG Interpretation None      MDM   Final diagnoses:  Urticaria   49 year old female with urticaria for the last couple weeks progressively worsening. Unsure of inciting allergen but we'll start her on a steroid taper. Also give her prescription for some Bactrim. Discussed with her that was 100% sure what was causing that she needed to follow-up with dermatologist and she stated that she would. Evidence of anaphylaxis.  I have personally and contemperaneously reviewed labs and imaging and used in my decision making as above.   A medical screening exam was performed and I feel the patient has had an appropriate workup for their chief complaint at this time and likelihood of emergent condition existing is low. They have been counseled on decision, discharge, follow up and which symptoms necessitate immediate return to the emergency department. They or their family verbally stated understanding and agreement with plan and discharged in stable condition.     I personally performed the services described in this documentation, which was scribed in my presence. The recorded information has been reviewed and is accurate.    Merrily Pew, MD 02/12/15 [phone]

## 2015-09-23 ENCOUNTER — Other Ambulatory Visit (HOSPITAL_COMMUNITY): Payer: 59 | Attending: Psychiatry | Admitting: Psychiatry

## 2015-09-23 ENCOUNTER — Encounter (HOSPITAL_COMMUNITY): Payer: Self-pay | Admitting: Psychiatry

## 2015-09-23 DIAGNOSIS — F419 Anxiety disorder, unspecified: Secondary | ICD-10-CM | POA: Insufficient documentation

## 2015-09-23 DIAGNOSIS — F331 Major depressive disorder, recurrent, moderate: Secondary | ICD-10-CM | POA: Diagnosis present
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
## 2015-09-24 ENCOUNTER — Encounter (HOSPITAL_COMMUNITY): Payer: Self-pay | Admitting: Psychiatry

## 2015-09-24 ENCOUNTER — Other Ambulatory Visit (HOSPITAL_COMMUNITY): Payer: 59 | Attending: Psychiatry | Admitting: Psychiatry

## 2015-09-24 DIAGNOSIS — F419 Anxiety disorder, unspecified: Secondary | ICD-10-CM | POA: Insufficient documentation

## 2015-09-24 DIAGNOSIS — F331 Major depressive disorder, recurrent, moderate: Secondary | ICD-10-CM | POA: Diagnosis not present

## 2015-09-24 NOTE — Progress Notes (Signed)
Psychiatric Initial Adult Assessment   Patient Identification: Linda Mcconnell MRN:  [id number] Date of Evaluation:  09/24/2015 Referral Source: Dr Runell Gess Chief Complaint:   Visit Diagnosis: major depression, recurrent,moderate  History of Present Illness:  [name] says she has been increasingly depressed over the last year or so.  It reached the point where she was having suicidal thoughts which is very unlike herself she said,  She has had sadness, tearfulness, decreased energy, motivation and interest, feeling overwhelmed and a need to escape, increased irritability and decreased appetite.  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  She is taking care of her 7 year old mother and they are not close, as well as her 50 year old granddaughter and 54 year old son live with her as well.  The granddaughter is fine, the son has a alcohol/drug problem.  Spiritual life is supportive and her church is a respite for her.  She has supportive friends and some supportive family.  She is not close to her siblings and had a passive mother and alcoholic father.  She does have anxiety and anxiety attacks regularly.  Associated Signs/Symptoms: Depression Symptoms:  depressed mood, anhedonia, insomnia, fatigue, difficulty concentrating, impaired memory, anxiety, loss of energy/fatigue, (Hypo) Manic Symptoms:  Irritable Mood, Anxiety Symptoms:  Excessive Worry, Psychotic Symptoms:  none PTSD Symptoms: Negative  Past Psychiatric History: none of significance  Previous Psychotropic Medications: Yes   Substance Abuse History in the last 12 months:  No.  Consequences of Substance Abuse: Negative  Past Medical History:  Past Medical History  Diagnosis Date  . Tubal reversal surgical follow up   . Migraine   . Anxiety   . Depression     Past Surgical History  Procedure Laterality Date  . Tubal ligation    . Reversed tubal ligation  2009  . Lypoma       Family Psychiatric History: mother and father seemed to have a drinking problem.  Brother committed suicide  Family History:  Family History  Problem Relation Age of Onset  . Diabetes      family hx  . Alcohol abuse Mother   . Depression Mother   . Depression Sister   . Anxiety disorder Sister   . Anxiety disorder Brother   . Depression Brother   . Anxiety disorder Sister   . Depression Sister   . Anxiety disorder Sister   . Depression Sister   . Anxiety disorder Brother   . Depression Brother   . Anxiety disorder Brother   . Depression Brother     Social History:   Social History   Social History  . Marital Status: Divorced    Spouse Name: N/A  . Number of Children: N/A  . Years of Education: N/A   Social History Main Topics  . Smoking status: Never Smoker   . Smokeless tobacco: Never Used  . Alcohol Use: 0.0 oz/week    0 Standard drinks or equivalent per week     Comment: "occassionally"  . Drug Use: No  . Sexual Activity: Not Currently   Other Topics Concern  . 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 butalbital-acetaminophen-caffeine (FIORICET) 50-325-40 MG tablet Take 1-2 tablets by mouth every 6 (six) hours as needed for headache. (Patient not taking: Reported on 09/23/2015) 20 tablet 0  . desvenlafaxine (PRISTIQ) 100 MG 24 hr tablet Take 100 mg by mouth daily. Reported on 09/23/2015    . hydrOXYzine (ATARAX/VISTARIL) 25 MG tablet Take 1 tablet (25 mg total) by mouth  every 6 (six) hours as needed for itching (do not take within 4 hours of benadryl). (Patient not taking: Reported on 09/23/2015) 20 tablet 0  . ibuprofen (ADVIL,MOTRIN) 200 MG tablet Take 600 mg by mouth every 4 (four) hours as needed for pain. Reported on 09/23/2015    . predniSONE (DELTASONE) 20 MG tablet 3 tabs po daily x 3 days, then 2 tabs x 3 days, then 1.5 tabs x 3 days, then 1 tab x 3 days, then 0.5 tabs x 3 days (Patient not taking: Reported on 09/23/2015) 27 tablet 0  . promethazine (PHENERGAN) 25 MG tablet Take 1 tablet (25 mg total) by mouth every 6 (six) hours as needed for nausea or vomiting. (Patient not taking: Reported on 09/23/2015) 15 tablet 0  . traMADol (ULTRAM) 50 MG tablet Take 1 or 2 tablets every 6 hours as needed for pain (Patient not taking: Reported on 09/23/2015) 60 tablet 2   No current facility-administered medications for this visit.    Neurologic: Headache: Negative Seizure: Negative Paresthesias:Negative  Musculoskeletal: Strength & Muscle Tone: within normal limits Gait & Station: normal Patient leans: N/A  Psychiatric Specialty Exam: ROS  There were no vitals taken for this visit.There is no weight on file to calculate BMI.  General Appearance: Well Groomed  Eye Contact:  Good  Speech:  Clear and Coherent  Volume:  Normal  Mood:  Depressed  Affect:  Appropriate  Thought Process:  Coherent and Goal Directed  Orientation:  Full (Time, Place, and Person)  Thought Content:  Logical  Suicidal Thoughts:  No  Homicidal Thoughts:  No  Memory:  Immediate;   Good Recent;   Good Remote;   Good  Judgement:  Good  Insight:  Good  Psychomotor Activity:  Normal  Concentration:  Concentration: Good and Attention Span: Good  Recall:  Good  Fund of Knowledge:Good  Language: Good  Akathisia:  Negative  Handed:  Right  AIMS (if indicated):  0  Assets:  Communication Skills Desire for Improvement Financial Resources/Insurance Housing Leisure Time [place] Talents/Skills Transportation Vocational/Educational  ADL's:  Intact  Cognition: WNL  Sleep:  insomnia    Treatment Plan Summary: daily group therapy   Donnelly Angelica, MD 6/6/201710:18 AM
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
## 2015-09-24 NOTE — Patient Instructions (Signed)
Patient is requesting discharge from [facility] today.  Follow up with Dr. Helane Rima and a therapist (appointment already scheduled).  Encouraged support groups.  Provided pt with Vocational Rehab and [facility] information.

## 2015-09-24 NOTE — Progress Notes (Signed)
    Daily Group Progress Note  Program: IOP  Group Time: 9:00-12:00  Participation Level: Active  Behavioral Response: Appropriate  Type of Therapy:  Group Therapy  Summary of Progress: Pt.'s first day of group. Pt. Completed assessment with case manager.      Nancie Neas, LPC

## 2015-09-25 ENCOUNTER — Other Ambulatory Visit (HOSPITAL_COMMUNITY): Payer: 59 | Admitting: Psychiatry

## 2015-09-25 NOTE — Progress Notes (Signed)
    Daily Group Progress Note  Program: IOP  Group Time: 9:00-10:30  Participation Level: Active  Behavioral Response: Appropriate  Type of Therapy:  Group Therapy  Summary of Progress: Pt. Presented as quiet, introduced herself to the group. Participated in discussion about the development of mindfulness and use to manage depression. Pt. Was encouraged to do daily check-in of ability and motivation and challenge self to engage in the day based on daily assessment.       Nancie Neas, LPC

## 2015-09-25 NOTE — Progress Notes (Signed)
Patient ID: [name], female   DOB: 1965-08-27, 50 y.o.   MRN: [id number] Discharge Note  Patient:  Linda Mcconnell is an 50 y.o., female DOB:  1965/11/01  Date of Admission:  09/23/2015  Date of Discharge:  09/25/2015  Reason for Admission:depression  IOP Course:  Ms amirian attended for one day and decided group was not for her and let us know she would not be back.  Mental Status at Discharge:no change from admission  Lab Results: No results found for this or any previous visit (from the past 48 hour(s)).   Current outpatient prescriptions:  .  butalbital-acetaminophen-caffeine (FIORICET) 50-325-40 MG tablet, Take 1-2 tablets by mouth every 6 (six) hours as needed for headache. (Patient not taking: Reported on 09/23/2015), Disp: 20 tablet, Rfl: 0 .  desvenlafaxine (PRISTIQ) 100 MG 24 hr tablet, Take 100 mg by mouth daily. Reported on 09/23/2015, Disp: , Rfl:  .  hydrOXYzine (ATARAX/VISTARIL) 25 MG tablet, Take 1 tablet (25 mg total) by mouth every 6 (six) hours as needed for itching (do not take within 4 hours of benadryl). (Patient not taking: Reported on 09/23/2015), Disp: 20 tablet, Rfl: 0 .  ibuprofen (ADVIL,MOTRIN) 200 MG tablet, Take 600 mg by mouth every 4 (four) hours as needed for pain. Reported on 09/23/2015, Disp: , Rfl:  .  predniSONE (DELTASONE) 20 MG tablet, 3 tabs po daily x 3 days, then 2 tabs x 3 days, then 1.5 tabs x 3 days, then 1 tab x 3 days, then 0.5 tabs x 3 days (Patient not taking: Reported on 09/23/2015), Disp: 27 tablet, Rfl: 0 .  promethazine (PHENERGAN) 25 MG tablet, Take 1 tablet (25 mg total) by mouth every 6 (six) hours as needed for nausea or vomiting. (Patient not taking: Reported on 09/23/2015), Disp: 15 tablet, Rfl: 0 .  traMADol (ULTRAM) 50 MG tablet, Take 1 or 2 tablets every 6 hours as needed for pain (Patient not taking: Reported on 09/23/2015), Disp: 60 tablet, Rfl: 2  Axis Diagnosis:  Major depression, recurrent, moderate   Level of Care:  IOP  Discharge  destination:  Has appointment with therapist at [facility].  Is patient on multiple antipsychotic therapies at discharge:  none   Has Patient had three or more failed trials of antipsychotic monotherapy by history:  Negative  Patient phone:  [phone] (home)  Patient address:   [place] [address],   Follow-up recommendations:  Activity:  continue current activity Diet:  continue current diet  Comments:  none  The patient received suicide prevention pamphlet:  Yes  Donnelly Angelica 09/25/2015, 9:58 AM

## 2015-09-25 NOTE — Progress Notes (Signed)
Daily Group Progress Note  Program: IOP  Group Time: 10:30-12:00  Participation Level: Minimal  Behavioral Response: Appropriate  Type of Therapy:  Group Therapy  Summary of Progress: Pt was new to the program today. She was mainly quiet and listened during group process. Pt minimally participated in a discussion on how her job is one of the biggest stressors in her life. Pt was encouraged to use mindfulness in her daily regemne of coping skills.

## 2015-09-26 ENCOUNTER — Other Ambulatory Visit (HOSPITAL_COMMUNITY): Payer: 59

## 2015-09-27 ENCOUNTER — Other Ambulatory Visit (HOSPITAL_COMMUNITY): Payer: 59

## 2015-09-30 ENCOUNTER — Other Ambulatory Visit (HOSPITAL_COMMUNITY): Payer: 59

## 2015-10-01 ENCOUNTER — Other Ambulatory Visit (HOSPITAL_COMMUNITY): Payer: 59

## 2015-10-01 DIAGNOSIS — F331 Major depressive disorder, recurrent, moderate: Secondary | ICD-10-CM | POA: Insufficient documentation

## 2015-10-02 ENCOUNTER — Other Ambulatory Visit (HOSPITAL_COMMUNITY): Payer: 59

## 2015-10-03 ENCOUNTER — Other Ambulatory Visit (HOSPITAL_COMMUNITY): Payer: 59

## 2015-10-04 ENCOUNTER — Other Ambulatory Visit (HOSPITAL_COMMUNITY): Payer: 59

## 2015-10-07 ENCOUNTER — Other Ambulatory Visit (HOSPITAL_COMMUNITY): Payer: 59

## 2015-10-08 ENCOUNTER — Other Ambulatory Visit (HOSPITAL_COMMUNITY): Payer: 59

## 2015-10-09 ENCOUNTER — Other Ambulatory Visit (HOSPITAL_COMMUNITY): Payer: 59

## 2015-10-10 ENCOUNTER — Other Ambulatory Visit (HOSPITAL_COMMUNITY): Payer: 59

## 2015-10-11 ENCOUNTER — Other Ambulatory Visit (HOSPITAL_COMMUNITY): Payer: 59

## 2015-10-14 ENCOUNTER — Other Ambulatory Visit (HOSPITAL_COMMUNITY): Payer: 59

## 2015-11-13 ENCOUNTER — Ambulatory Visit (INDEPENDENT_AMBULATORY_CARE_PROVIDER_SITE_OTHER): Payer: 59 | Admitting: Family Medicine

## 2015-11-13 VITALS — BP 112/72 | HR 79 | Ht 63.0 in | Wt 145.0 lb

## 2015-11-13 DIAGNOSIS — A499 Bacterial infection, unspecified: Secondary | ICD-10-CM

## 2015-11-13 DIAGNOSIS — N760 Acute vaginitis: Secondary | ICD-10-CM

## 2015-11-13 DIAGNOSIS — B9689 Other specified bacterial agents as the cause of diseases classified elsewhere: Secondary | ICD-10-CM

## 2015-11-13 DIAGNOSIS — N390 Urinary tract infection, site not specified: Secondary | ICD-10-CM | POA: Diagnosis not present

## 2015-11-13 MED ORDER — CIPROFLOXACIN HCL 500 MG PO TABS: 500.0000 mg | ORAL_TABLET | Freq: Two times a day (BID) | ORAL | 0 refills | Status: DC

## 2015-11-13 MED ORDER — METRONIDAZOLE 500 MG PO TABS: 500.0000 mg | ORAL_TABLET | Freq: Two times a day (BID) | ORAL | 0 refills | Status: DC

## 2015-11-13 NOTE — Progress Notes (Signed)
Pre visit review using our clinic review tool, if applicable. No additional management support is needed unless otherwise documented below in the visit note. 

## 2015-11-13 NOTE — Progress Notes (Signed)
   Subjective:    Patient ID: Linda Mcconnell, female    DOB: May 07, 1965, 50 y.o.   MRN: [id number]  HPI Here for several issues. First she has had a clear vaginal DC for 3 days which has a fishy odor. No itching or pelvic pain. Also for 2 days she has had some burning and urgency with urinations. No fever. Her LMP was last week.    Review of Systems  Constitutional: Negative.   Gastrointestinal: Negative.   Genitourinary: Positive for dysuria, frequency, urgency and vaginal discharge. Negative for flank pain, hematuria, pelvic pain, vaginal bleeding and vaginal pain.       Objective:   Physical Exam  Constitutional: She appears well-developed and well-nourished. No distress.  Abdominal: Soft. Bowel sounds are normal. She exhibits no distension and no mass. There is no tenderness. There is no rebound and no guarding.          Assessment & Plan:  She is unable to produce a urine sample today. We will treat the UTI with Cipro for 7 days. Treat the bacterial vaginitis with Flagyl for 7 days.  Laurey Morale, MD

## 2016-04-03 ENCOUNTER — Ambulatory Visit: Payer: Self-pay | Admitting: Family Medicine

## 2016-04-20 HISTORY — PX: VAGINA SURGERY: SHX829

## 2016-08-12 ENCOUNTER — Emergency Department (HOSPITAL_BASED_OUTPATIENT_CLINIC_OR_DEPARTMENT_OTHER)
Admission: EM | Admit: 2016-08-12 | Discharge: 2016-08-12 | Disposition: A | Discharge status: Home / Self Care | Payer: 59 | Attending: Emergency Medicine | Admitting: Emergency Medicine

## 2016-08-12 ENCOUNTER — Encounter (HOSPITAL_BASED_OUTPATIENT_CLINIC_OR_DEPARTMENT_OTHER): Payer: Self-pay | Admitting: Emergency Medicine

## 2016-08-12 DIAGNOSIS — R1084 Generalized abdominal pain: Secondary | ICD-10-CM

## 2016-08-12 DIAGNOSIS — R110 Nausea: Secondary | ICD-10-CM

## 2016-08-12 DIAGNOSIS — R531 Weakness: Secondary | ICD-10-CM | POA: Insufficient documentation

## 2016-08-12 LAB — LIPASE, BLOOD: LIPASE: 25 U/L (ref 11–51)

## 2016-08-12 LAB — CBC WITH DIFFERENTIAL/PLATELET
BASOS ABS: 0 10*3/uL (ref 0.0–0.1)
Basophils Relative: 0 %
EOS PCT: 1 %
Eosinophils Absolute: 0 10*3/uL (ref 0.0–0.7)
HEMATOCRIT: 37 % (ref 36.0–46.0)
Hemoglobin: 12.6 g/dL (ref 12.0–15.0)
LYMPHS ABS: 1.5 10*3/uL (ref 0.7–4.0)
LYMPHS PCT: 28 %
MCH: 32 pg (ref 26.0–34.0)
MCHC: 34.1 g/dL (ref 30.0–36.0)
MCV: 93.9 fL (ref 78.0–100.0)
MONO ABS: 0.4 10*3/uL (ref 0.1–1.0)
Monocytes Relative: 8 %
NEUTROS ABS: 3.4 10*3/uL (ref 1.7–7.7)
Neutrophils Relative %: 63 %
Platelets: 234 10*3/uL (ref 150–400)
RBC: 3.94 MIL/uL (ref 3.87–5.11)
RDW: 12.3 % (ref 11.5–15.5)
WBC: 5.3 10*3/uL (ref 4.0–10.5)

## 2016-08-12 LAB — COMPREHENSIVE METABOLIC PANEL
ALBUMIN: 4.1 g/dL (ref 3.5–5.0)
ALT: 13 U/L — AB (ref 14–54)
AST: 18 U/L (ref 15–41)
Alkaline Phosphatase: 72 U/L (ref 38–126)
Anion gap: 11 (ref 5–15)
BUN: 13 mg/dL (ref 6–20)
CHLORIDE: 105 mmol/L (ref 101–111)
CO2: 24 mmol/L (ref 22–32)
CREATININE: 1.16 mg/dL — AB (ref 0.44–1.00)
Calcium: 9.3 mg/dL (ref 8.9–10.3)
GFR calc Af Amer: >60 mL/min (ref >60)
GFR, EST NON AFRICAN AMERICAN: 54 mL/min — AB (ref >60)
GLUCOSE: 113 mg/dL — AB (ref 65–99)
POTASSIUM: 3.5 mmol/L (ref 3.5–5.1)
SODIUM: 140 mmol/L (ref 135–145)
Total Bilirubin: 0.7 mg/dL (ref 0.3–1.2)
Total Protein: 7.8 g/dL (ref 6.5–8.1)

## 2016-08-12 LAB — URINALYSIS, MICROSCOPIC (REFLEX)

## 2016-08-12 LAB — URINALYSIS, ROUTINE W REFLEX MICROSCOPIC
GLUCOSE, UA: NEGATIVE mg/dL
KETONES UR: 40 mg/dL — AB
Nitrite: NEGATIVE
Protein, ur: 100 mg/dL — AB
Specific Gravity, Urine: 1.027 (ref 1.005–1.030)
pH: 6 (ref 5.0–8.0)

## 2016-08-12 LAB — PREGNANCY, URINE: PREG TEST UR: NEGATIVE

## 2016-08-12 LAB — TROPONIN I: Troponin I: <0.03 ng/mL (ref <0.03)

## 2016-08-12 MED ORDER — SODIUM CHLORIDE 0.9 % IV BOLUS (SEPSIS)
1000.0000 mL | Freq: Once | INTRAVENOUS | Status: AC
  Administered 2016-08-12: 1000 mL via INTRAVENOUS

## 2016-08-12 NOTE — Discharge Instructions (Signed)
You have been seen in the Emergency Department (ED) for abdominal pain and generalized weakness.  Your evaluation did not identify a clear cause of your symptoms but was generally reassuring.  Please follow up as instructed above regarding today?s emergent visit and the symptoms that are bothering you.  Return to the ED if your abdominal pain worsens or fails to improve, you develop bloody vomiting, bloody diarrhea, you are unable to tolerate fluids due to vomiting, fever greater than 101, or other symptoms that concern you.

## 2016-08-12 NOTE — ED Triage Notes (Signed)
Nausea x 1 week, at intervals, no diarrhea or abd pain

## 2016-08-12 NOTE — ED Provider Notes (Signed)
Emergency Department Provider Note   I have reviewed the triage vital signs and the nursing notes.   HISTORY  Chief Complaint Nausea   HPI Linda Mcconnell is a 51 y.o. female with PMH of anxiety, depression, and migraine HA presents to the emergency department for evaluation of persistent nausea, weight loss, worsening lightheadedness. Patient states that yesterday she began feeling lightheaded when standing and this morning it got worse. She denies any sudden onset severe headache, vision changes, unilateral weakness or numbness. She reports some diffuse abdominal discomfort. No significant nausea at this time. Patient denies any chest pain or difficulty breathing. No palpitations or chest pain during the near-syncope episodes. No history of similar in the past.   Past Medical History:  Diagnosis Date  . Anxiety   . Depression   . Migraine   . Tubal reversal surgical follow up     Patient Active Problem List   Diagnosis Date Noted  . Depression, major, recurrent, moderate ([place]) 10/01/2015    Class: Chronic  . RECTAL PAIN 01/08/2010  . TENESMUS 01/02/2010  . ANAL FISSURE 12/31/2009  . ACUTE DACRYOCYSTITIS 09/05/2009  . ACUTE SINUSITIS, UNSPECIFIED 03/11/2009    Past Surgical History:  Procedure Laterality Date  . lypoma    . reversed tubal ligation  2009  . TUBAL LIGATION      Current Outpatient Rx  . Order #: [id number] Class: Historical Med  . Order #: [id number] Class: Normal  . Order #: [id number] Class: Historical Med  . Order #: [id number] Class: Normal  . Order #: [id number] Class: Historical Med  . Order #: [id number] Class: Historical Med    Allergies Migraine formula [aspirin-acetaminophen-caffeine]  Family History  Problem Relation Age of Onset  . Depression Sister   . Anxiety disorder Sister   . Anxiety disorder Brother   . Depression Brother   . Anxiety disorder Sister   . Depression Sister   . Anxiety disorder Sister   . Depression Sister   . Anxiety  disorder Brother   . Depression Brother   . Anxiety disorder Brother   . Depression Brother   . Diabetes      family hx  . Alcohol abuse Mother   . Depression Mother     Social History Social History  Substance Use Topics  . Smoking status: Never Smoker  . Smokeless tobacco: Never Used  . Alcohol use 0.0 oz/week     Comment: "occassionally"    Review of Systems  Constitutional: No fever/chills. .Positive generalized weakness.  Eyes: No visual changes. ENT: No sore throat. Cardiovascular: Denies chest pain. Respiratory: Denies shortness of breath. Gastrointestinal: No abdominal pain.  No nausea, no vomiting.  No diarrhea.  No constipation. Genitourinary: Negative for dysuria. Musculoskeletal: Negative for back pain. Skin: Negative for rash. Neurological: Negative for headaches, focal weakness or numbness.  10-point ROS otherwise negative.  ____________________________________________   PHYSICAL EXAM:  VITAL SIGNS: ED Triage Vitals [08/12/16 0824]  Enc Vitals Group     BP (!) 107/58     Pulse Rate 97     Resp 18     Temp 98.3 F (36.8 C)     Temp Source Oral     SpO2 100 %     Weight 134 lb (60.8 kg)     Height 5\' 3"  (1.6 m)   Constitutional: Alert and oriented. Well appearing and in no acute distress. Eyes: Conjunctivae are normal. Head: Atraumatic. Nose: No congestion/rhinnorhea. Mouth/Throat: Mucous membranes are dry. Oropharynx non-erythematous. Neck: No stridor.  Cardiovascular: Normal  rate, regular rhythm. Good peripheral circulation. Grossly normal heart sounds.   Respiratory: Normal respiratory effort.  No retractions. Lungs CTAB. Gastrointestinal: Soft and nontender. No distention.  Musculoskeletal: No lower extremity tenderness nor edema. No gross deformities of extremities. Neurologic:  Normal speech and language. No gross focal neurologic deficits are appreciated.  Skin:  Skin is warm, dry and intact. No rash  noted.   ____________________________________________   LABS (all labs ordered are listed, but only abnormal results are displayed)  Labs Reviewed  URINALYSIS, ROUTINE W REFLEX MICROSCOPIC - Abnormal; Notable for the following:       Result Value   Color, Urine AMBER (*)    APPearance TURBID (*)    Hgb urine dipstick LARGE (*)    Bilirubin Urine SMALL (*)    Ketones, ur 40 (*)    Protein, ur 100 (*)    Leukocytes, UA TRACE (*)    All other components within normal limits  COMPREHENSIVE METABOLIC PANEL - Abnormal; Notable for the following:    Glucose, Bld 113 (*)    Creatinine, Ser 1.16 (*)    ALT 13 (*)    GFR calc non Af Amer 54 (*)    All other components within normal limits  URINALYSIS, MICROSCOPIC (REFLEX) - Abnormal; Notable for the following:    Bacteria, UA MANY (*)    Squamous Epithelial / LPF 0-5 (*)    All other components within normal limits  PREGNANCY, URINE  LIPASE, BLOOD  CBC WITH DIFFERENTIAL/PLATELET  TROPONIN I   ____________________________________________  EKG   EKG Interpretation  Date/Time:  Wednesday August 12 2016 09:10:06 EDT Ventricular Rate:  88 PR Interval:    QRS Duration: 82 QT Interval:  366 QTC Calculation: 443 R Axis:   72 Text Interpretation:  Sinus rhythm Baseline wander in lead(s) V5 V6 No STEMI.  Confirmed by LONG MD, JOSHUA [phone]) on 08/12/2016 9:20:19 AM Also confirmed by LONG MD, JOSHUA [phone]), editor Drema Pry [phone])  on 08/12/2016 9:22:43 AM       ____________________________________________  RADIOLOGY  None ____________________________________________   PROCEDURES  Procedure(s) performed:   Procedures  None ____________________________________________   INITIAL IMPRESSION / ASSESSMENT AND PLAN / ED COURSE  Pertinent labs & imaging results that were available during my care of the patient were reviewed by me and considered in my medical decision making (see chart for details).  Patient  with generalized weakness and nausea. Does not want nausea medication. Reports intermittent heavy vaginal bleeding but none recently. No CP, SOB, or palpitations.   10:36 AM Patient feeling much better after IV fluids. She does not want nausea medication here in the emergency department or for home because she had a bad reaction to some nausea medication given during a migraine headache. She is feeling much better and is ready for discharge. EKG normal. No positive orthostatic vital signs. Repeat abdominal exam unremarkable.  At this time, I do not feel there is any life-threatening condition present. I have reviewed and discussed all results (EKG, imaging, lab, urine as appropriate), exam findings with patient. I have reviewed nursing notes and appropriate previous records.  I feel the patient is safe to be discharged home without further emergent workup. Discussed usual and customary return precautions. Patient and family (if present) verbalize understanding and are comfortable with this plan.  Patient will follow-up with their primary care provider. If they do not have a primary care provider, information for follow-up has been provided to them. All questions have been answered.  ____________________________________________  FINAL CLINICAL IMPRESSION(S) / ED DIAGNOSES  Final diagnoses:  Nausea  Generalized weakness  Generalized abdominal pain     MEDICATIONS GIVEN DURING THIS VISIT:  Medications  sodium chloride 0.9 % bolus 1,000 mL (0 mLs Intravenous Stopped 08/12/16 1044)     NEW OUTPATIENT MEDICATIONS STARTED DURING THIS VISIT:  None   Note:  This document was prepared using Dragon voice recognition software and may include unintentional dictation errors.  Nanda Quinton, MD Emergency Medicine   Margette Fast, MD 08/12/16 [phone]

## 2017-05-14 DIAGNOSIS — Z0000 Encounter for general adult medical examination without abnormal findings: Secondary | ICD-10-CM | POA: Diagnosis not present

## 2017-05-14 DIAGNOSIS — Z1231 Encounter for screening mammogram for malignant neoplasm of breast: Secondary | ICD-10-CM | POA: Diagnosis not present

## 2017-05-14 DIAGNOSIS — Z01419 Encounter for gynecological examination (general) (routine) without abnormal findings: Secondary | ICD-10-CM | POA: Diagnosis not present

## 2018-05-28 ENCOUNTER — Emergency Department (HOSPITAL_BASED_OUTPATIENT_CLINIC_OR_DEPARTMENT_OTHER)
Admission: EM | Admit: 2018-05-28 | Discharge: 2018-05-28 | Disposition: A | Discharge status: Home / Self Care | Payer: No Typology Code available for payment source | Attending: Emergency Medicine | Admitting: Emergency Medicine

## 2018-05-28 ENCOUNTER — Other Ambulatory Visit: Payer: Self-pay

## 2018-05-28 ENCOUNTER — Encounter (HOSPITAL_BASED_OUTPATIENT_CLINIC_OR_DEPARTMENT_OTHER): Payer: Self-pay | Admitting: Emergency Medicine

## 2018-05-28 DIAGNOSIS — Y92410 Unspecified street and highway as the place of occurrence of the external cause: Secondary | ICD-10-CM | POA: Diagnosis not present

## 2018-05-28 DIAGNOSIS — Y9389 Activity, other specified: Secondary | ICD-10-CM | POA: Diagnosis not present

## 2018-05-28 DIAGNOSIS — S46811A Strain of other muscles, fascia and tendons at shoulder and upper arm level, right arm, initial encounter: Secondary | ICD-10-CM | POA: Diagnosis not present

## 2018-05-28 DIAGNOSIS — Z79899 Other long term (current) drug therapy: Secondary | ICD-10-CM | POA: Diagnosis not present

## 2018-05-28 DIAGNOSIS — S060X0A Concussion without loss of consciousness, initial encounter: Secondary | ICD-10-CM | POA: Insufficient documentation

## 2018-05-28 DIAGNOSIS — S199XXA Unspecified injury of neck, initial encounter: Secondary | ICD-10-CM | POA: Diagnosis present

## 2018-05-28 DIAGNOSIS — Y999 Unspecified external cause status: Secondary | ICD-10-CM | POA: Insufficient documentation

## 2018-05-28 NOTE — Discharge Instructions (Signed)
Take 4 over the counter ibuprofen tablets 3 times a day or 2 over-the-counter naproxen tablets twice a day for pain. Also take tylenol 1000mg(2 extra strength) four times a day.    

## 2018-05-28 NOTE — ED Notes (Signed)
ED Provider at bedside. 

## 2018-05-28 NOTE — ED Notes (Signed)
Pt verbalized understanding of dc instructions.
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
## 2018-06-02 DIAGNOSIS — M791 Myalgia, unspecified site: Secondary | ICD-10-CM | POA: Diagnosis not present

## 2018-06-02 DIAGNOSIS — G4700 Insomnia, unspecified: Secondary | ICD-10-CM | POA: Diagnosis not present

## 2018-06-03 ENCOUNTER — Encounter: Payer: Self-pay | Admitting: Physical Therapy

## 2018-06-03 ENCOUNTER — Other Ambulatory Visit: Payer: Self-pay

## 2018-06-03 ENCOUNTER — Ambulatory Visit: Payer: Medicaid Other | Attending: Obstetrics and Gynecology | Admitting: Physical Therapy

## 2018-06-03 DIAGNOSIS — M545 Low back pain, unspecified: Secondary | ICD-10-CM

## 2018-06-03 DIAGNOSIS — M542 Cervicalgia: Secondary | ICD-10-CM | POA: Diagnosis not present

## 2018-06-03 NOTE — Patient Instructions (Signed)
Access Code: [email or web address]  URL: [email or web address]  Date: 06/03/2018  Prepared by: Ruben Im   Exercises  Seated Cervical Rotation AROM - 10 reps - 1 sets - 1x daily - 7x weekly  Seated Neck Sidebending ROM - 10 reps - 1 sets - 1x daily - 7x weekly  Quadruped Cat Camel - 10 reps - 1 sets - 1x daily - 7x weekly  Seated Long Arc Quad - 10 reps - 1 sets - 1x daily - 7x weekly     TENS UNIT  This is helpful for muscle pain and spasm.   Search and Purchase a TENS 7000 2nd edition at www.tenspros.com or www.amazon.com  (It should be less than $30)     TENS unit instructions:   Do not shower or bathe with the unit on  Turn the unit off before removing electrodes or batteries  If the electrodes lose stickiness add a drop of water to the electrodes after they are disconnected from the unit and place on plastic sheet. If you continued to have difficulty, call the TENS unit company to purchase more electrodes.  Do not apply lotion on the skin area prior to use. Make sure the skin is clean and dry as this will help prolong the life of the electrodes.  After use, always check skin for unusual red areas, rash or other skin difficulties. If there are any skin problems, does not apply electrodes to the same area.  Never remove the electrodes from the unit by pulling the wires.  Do not use the TENS unit or electrodes other than as directed.  Do not change electrode placement without consulting your therapist or physician.  Keep 2 fingers with between each electrode.   TENS stands for Transcutaneous Electrical Nerve Stimulation. In other words, electrical impulses are allowed to pass through the skin in order to excite a nerve.   Purpose and Use of TENS:  TENS is a method used to manage acute and chronic pain without the use of drugs. It has been effective in managing pain associated with surgery, sprains, strains, trauma, rheumatoid arthritis, and neuralgias. It is a  non-addictive, low risk, and non-invasive technique used to control pain. It is not, by any means, a curative form of treatment.   How TENS Works:  Most TENS units are a Paramedic unit powered by one 9 volt battery. Attached to the outside of the unit are two lead wires where two pins and/or snaps connect on each wire. All units come with a set of four reusable pads or electrodes. These are placed on the skin surrounding the area involved. By inserting the leads into  the pads, the electricity can pass from the unit making the circuit complete.  As the intensity is turned up slowly, the electrical current enters the body from the electrodes through the skin to the surrounding nerve fibers. This triggers the release of hormones from within the body. These hormones contain pain relievers. By increasing the circulation of these hormones, the person's pain may be lessened. It is also believed that the electrical stimulation itself helps to block the pain messages being sent to the brain, thus also decreasing the body's perception of pain.   Hazards:  TENS units are NOT to be used by patients with PACEMAKERS, DEFIBRILLATORS, DIABETIC PUMPS, PREGNANT WOMEN, and patients with SEIZURE DISORDERS.  TENS units are NOT to be used over the heart, throat, brain, or spinal cord.  One of the major side effects from the TENS unit  may be skin irritation. Some people may develop a rash if they are sensitive to the materials used in the electrodes or the connecting wires.     Avoid overuse due the body getting used to the stem making it not as effective over time.
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
## 2018-06-27 ENCOUNTER — Ambulatory Visit
Payer: No Typology Code available for payment source | Attending: Obstetrics and Gynecology | Admitting: Physical Therapy

## 2018-06-27 ENCOUNTER — Encounter: Payer: Self-pay | Admitting: Physical Therapy

## 2018-06-27 DIAGNOSIS — M542 Cervicalgia: Secondary | ICD-10-CM | POA: Diagnosis present

## 2018-06-27 DIAGNOSIS — M545 Low back pain, unspecified: Secondary | ICD-10-CM

## 2018-06-27 NOTE — Patient Instructions (Signed)
Access Code: [email or web address]  URL: [email or web address]  Date: 06/27/2018  Prepared by: Venetia Night Beuhring   Exercises  Seated Cervical Rotation AROM - 10 reps - 1 sets - 1x daily - 7x weekly  Seated Neck Sidebending ROM - 10 reps - 1 sets - 1x daily - 7x weekly  Quadruped Cat Camel - 10 reps - 1 sets - 1x daily - 7x weekly  Seated Long Arc Quad - 10 reps - 1 sets - 1x daily - 7x weekly  Supine Posterior Pelvic Tilt - 10 reps - 2 sets - 1x daily - 7x weekly  Supine Double Knee to Chest - 10 reps - 3 sets - 1x daily - 7x weekly  Supine Lower Trunk Rotation - 10 reps - 3 sets - 1x daily - 7x weekly  Supine Piriformis Stretch with Foot on Ground - 2 reps - 2 sets - 20 hold - 1x daily - 7x weekly  Hooklying Gluteal Sets - 5 reps - 3 sets - 5 hold - 1x daily - 7x weekly  Hooklying Isometric Hip Flexion - 5 reps - 3 sets - 5 hold - 1x daily - 7x weekly  Hooklying Transversus Abdominis Palpation - 10 reps - 2 sets - 10 hold - 1x daily - 7x weekly

## 2018-06-27 NOTE — Therapy (Signed)
[facility] [address], [place], [place], [address] Phone: [phone]   Fax:  [phone]  Physical Therapy Treatment  Patient Details  Name: Linda Mcconnell MRN: [id number] Date of Birth: [birth year] Referring Provider (PT): Dr. Helane Rima   Encounter Date: 06/27/2018  PT End of Session - 06/27/18 1611    Visit Number  2    Date for PT Re-Evaluation  07/29/18    Authorization Type  approved for 3 visits 2/19-3/17/20    Authorization - Visit Number  1    Authorization - Number of Visits  3    PT Start Time  6301    PT Stop Time  1627    PT Time Calculation (min)  54 min    Activity Tolerance  Patient limited by pain    Behavior During Therapy  [facility] for tasks assessed/performed       Past Medical History:  Diagnosis Date  . Anxiety   . Depression   . Migraine   . Tubal reversal surgical follow up     Past Surgical History:  Procedure Laterality Date  . lypoma    . reversed tubal ligation  2009  . TUBAL LIGATION      There were no vitals filed for this visit.  Subjective Assessment - 06/27/18 1534    Subjective  Pt states pain is about the same as at evaluation.  Taking pain meds at night.  Doing HEP.    Pertinent History  hx of LBP but no problem in years    Limitations  Other (comment)    How long can you sit comfortably?  < 30 minutes     How long can you walk comfortably?  not much of a problem     Diagnostic tests  none    Patient Stated Goals  get back to normal self; just started this career    Currently in Pain?  Yes    Pain Score  8     Pain Location  Back    Pain Orientation  Lower;Right;Left    Pain Type  Acute pain    Aggravating Factors   driving, sitting    Pain Relieving Factors  ice         [facility] PT Assessment - 06/27/18 0001      AROM   Cervical - Right Rotation  65    Cervical - Left Rotation  70                   [facility] Adult PT Treatment/Exercise - 06/27/18  0001      Exercises   Exercises  Lumbar;Neck      Lumbar Exercises: Stretches   Double Knee to Chest Stretch  5 reps    Double Knee to Chest Stretch Limitations  feet on green ball    Lower Trunk Rotation  5 reps;10 seconds    Pelvic Tilt  10 reps;5 seconds    Piriformis Stretch  2 reps;Left;Right;20 seconds      Lumbar Exercises: Supine   Ab Set  5 reps    AB Set Limitations  Pt with difficulty despite cueing    Pelvic Tilt  10 reps    Glut Set  5 reps;5 seconds    Isometric Hip Flexion  5 reps;5 seconds      Modalities   Modalities  Cryotherapy;Electrical Stimulation      Cryotherapy   Number Minutes Cryotherapy  10 Minutes    Cryotherapy Location  Cervical;Lumbar Spine    Type of Cryotherapy  Ice pack      Electrical Stimulation   Electrical Stimulation Location  upper lumbar    Electrical Stimulation Action  TENS    Electrical Stimulation Parameters  10 MIN    Electrical Stimulation Goals  Pain             PT Education - 06/27/18 1611    Education Details  Access Code: [email or web address]     Person(s) Educated  Patient    Methods  Explanation;Demonstration;Verbal cues;Handout;Tactile cues    Comprehension  Verbalized understanding;Returned demonstration       PT Short Term Goals - 06/03/18 1246      PT SHORT TERM GOAL #1   Title  The patient will report knowledge of basic self care strategies to promote healing: change of position, walking, postural support    Time  4    Period  Weeks    Status  New    Target Date  07/01/18      PT SHORT TERM GOAL #2   Title  The patient will report a 30% improvement in neck and back pain     Time  4    Period  Weeks    Status  New        PT Long Term Goals - 06/03/18 1248      PT LONG TERM GOAL #1   Title  The patient will be independent in safe self progression of HEP     Time  8    Period  Weeks    Status  New    Target Date  07/29/18      PT LONG TERM GOAL #2   Title  The patient will report a 60% improvement  in neck and back pain with home and work ADLs    Time  8    Period  Weeks    Status  New      PT LONG TERM GOAL #3   Title  The patient will have improved cervical flexion to 40 degrees, sidebending to 20 degrees and rotation to 25 degrees needed for driving    Time  8    Period  Weeks    Status  New      PT LONG TERM GOAL #4   Title  Lumbar flexion ROM to 30, extension to 15 and sidebending to 25 degrees needed for household chores    Time  8    Period  Weeks    Status  New      PT LONG TERM GOAL #5   Title  Modified Oswestry score improved to 40% indicating improved function with less pain     Time  8    Period  Weeks    Status  New            Plan - 06/27/18 1612    Clinical Impression Statement  Pt agreeable to movement based ther ex today but with ongoing report of pain.  Neck ROM has improved significantly since evaluation and Pt has been compliant with initial HEP.  Pt with poor awareness of core muscles and had difficulty with this despite PT verbal and tactile cues in supine.  PT instructed Pt in use of towel roll in sitting to relieve pain with driving for work which Pt reported relief with.  PT used estim and ice for pain end of session.  Pt will continue to benefit from skilled PT along POC for gentle  ROM, stabilization training, manual techniques, modalities for pain and functional training.    Rehab Potential  Good    PT Frequency  2x / week    PT Duration  8 weeks    PT Treatment/Interventions  ADLs/Self Care Home Management;Electrical Stimulation;Cryotherapy;Ultrasound;Traction;Moist Heat;Therapeutic activities;Therapeutic exercise;Patient/family education;Neuromuscular re-education;Manual techniques;Dry needling;Taping    PT Next Visit Plan  trial taping for lumbar/cervical, f/u on HEP/use of towel roll in sitting, gentle cervical/lumbar mobility ex for acute pain;  ES/ice;  patient education    PT Home Exercise Plan  Access Code: [email or web address]    Consulted and Agree  with Plan of Care  Patient       Patient will benefit from skilled therapeutic intervention in order to improve the following deficits and impairments:  Pain, Decreased range of motion, Decreased strength, Impaired perceived functional ability  Visit Diagnosis: Cervicalgia  Acute bilateral low back pain without sciatica     Problem List Patient Active Problem List   Diagnosis Date Noted  . Depression, major, recurrent, moderate ([place]) 10/01/2015    Class: Chronic  . RECTAL PAIN 01/08/2010  . TENESMUS 01/02/2010  . ANAL FISSURE 12/31/2009  . ACUTE DACRYOCYSTITIS 09/05/2009  . ACUTE SINUSITIS, UNSPECIFIED 03/11/2009    Baruch Merl, PT 06/27/18 4:23 PM   [facility] [address], [place], [place], [address] Phone: [phone]   Fax:  [phone]  Name: RUPA LAGAN MRN: [id number] Date of Birth: [birth year]

## 2018-06-29 ENCOUNTER — Other Ambulatory Visit: Payer: Self-pay

## 2018-06-29 ENCOUNTER — Ambulatory Visit: Payer: No Typology Code available for payment source

## 2018-06-29 DIAGNOSIS — M545 Low back pain, unspecified: Secondary | ICD-10-CM

## 2018-06-29 DIAGNOSIS — M542 Cervicalgia: Secondary | ICD-10-CM | POA: Diagnosis not present

## 2018-06-29 NOTE — Patient Instructions (Signed)

## 2018-06-29 NOTE — Therapy (Signed)
[facility] [address] [place], [place], [address] Phone: [phone]   Fax:  [phone]  Physical Therapy Treatment  Patient Details  Name: Linda Mcconnell MRN: [id number] Date of Birth: [birth year] Referring Provider (PT): Dr. Helane Rima   Encounter Date: 06/29/2018  PT End of Session - 06/29/18 1655    Visit Number  3    Date for PT Re-Evaluation  07/29/18    Authorization Type  approved for 3 visits 2/19-3/17/20    Authorization - Visit Number  2    Authorization - Number of Visits  3    PT Start Time  9449    PT Stop Time  1702    PT Time Calculation (min)  45 min    Activity Tolerance  Patient limited by pain    Behavior During Therapy  [facility] for tasks assessed/performed       Past Medical History:  Diagnosis Date  . Anxiety   . Depression   . Migraine   . Tubal reversal surgical follow up     Past Surgical History:  Procedure Laterality Date  . lypoma    . reversed tubal ligation  2009  . TUBAL LIGATION      There were no vitals filed for this visit.  Subjective Assessment - 06/29/18 1617    Subjective  Today was a hard day.  I slept a lot.      Currently in Pain?  Yes    Pain Score  7     Pain Location  Back    Pain Orientation  Right;Left;Lower    Pain Descriptors / Indicators  Aching;Sore    Pain Type  Acute pain    Aggravating Factors   driving, sitting    Pain Relieving Factors  medication, ice    Pain Score  7    Pain Location  Neck    Pain Orientation  Right    Pain Descriptors / Indicators  Aching;Sore    Pain Frequency  Constant    Aggravating Factors   turning head    Pain Relieving Factors  ice, medication                       [facility] Adult PT Treatment/Exercise - 06/29/18 0001      Neck Exercises: Machines for Strengthening   Nustep  Level 2 x 8 minutes   PT present to discuss progress     Lumbar Exercises: Stretches   Single Knee to Chest Stretch   Left;Right;2 reps;20 seconds      Lumbar Exercises: Sidelying   Other Sidelying Lumbar Exercises  open book stretch x15 each      Modalities   Modalities  Moist Heat      Moist Heat Therapy   Number Minutes Moist Heat  10 Minutes    Moist Heat Location  Lumbar Spine;Cervical      Manual Therapy   Manual Therapy  Soft tissue mobilization;Myofascial release    Manual therapy comments  attemped soft tissue to Rt upper traps- pt too sensitive       Trigger Point Dry Needling - 06/29/18 0001    Consent Given?  Yes    Education Handout Provided  Yes    Muscles Treated Head and Neck  Upper trapezius;Cervical multifidi    Dry Needling Comments  Rt only    Upper Trapezius Response  Twitch reponse elicited;Palpable increased muscle length    Cervical multifidi Response  --  PT Education - 06/29/18 1636    Education Details  dry needling info    Person(s) Educated  Patient    Methods  Explanation;Demonstration;Verbal cues    Comprehension  Verbalized understanding;Returned demonstration       PT Short Term Goals - 06/29/18 1623      PT SHORT TERM GOAL #1   Title  The patient will report knowledge of basic self care strategies to promote healing: change of position, walking, postural support    Status  Achieved        PT Long Term Goals - 06/03/18 1248      PT LONG TERM GOAL #1   Title  The patient will be independent in safe self progression of HEP     Time  8    Period  Weeks    Status  New    Target Date  07/29/18      PT LONG TERM GOAL #2   Title  The patient will report a 60% improvement in neck and back pain with home and work ADLs    Time  8    Period  Weeks    Status  New      PT LONG TERM GOAL #3   Title  The patient will have improved cervical flexion to 40 degrees, sidebending to 20 degrees and rotation to 25 degrees needed for driving    Time  8    Period  Weeks    Status  New      PT LONG TERM GOAL #4   Title  Lumbar flexion ROM to 30,  extension to 15 and sidebending to 25 degrees needed for household chores    Time  8    Period  Weeks    Status  New      PT LONG TERM GOAL #5   Title  Modified Oswestry score improved to 40% indicating improved function with less pain     Time  8    Period  Weeks    Status  New            Plan - 06/29/18 1635    Clinical Impression Statement  Pt with minimal change in pain due to limited attendance with PT.  Pt with improved cervical A/ROM this week and is able to move with increased ease.  Pt with main complaint of Rt neck pain and stiffness. Pt didn't tolerate dry needling today and requested to stop after 2 needles into the Rt UT.   Pt with consistent 7-8/10 reported with movement.  Pt was educated regarding of importance of movement and gentle mobility.  Pt will need reassessment next session for Medicaid visits and will benefit from continued PT for flexibility, strength and manual therapy to allow for return to regular function.      Rehab Potential  Good    PT Frequency  2x / week    PT Duration  8 weeks    PT Treatment/Interventions  ADLs/Self Care Home Management;Electrical Stimulation;Cryotherapy;Ultrasound;Traction;Moist Heat;Therapeutic activities;Therapeutic exercise;Patient/family education;Neuromuscular re-education;Manual techniques;Dry needling;Taping    PT Next Visit Plan  assess response to dry needling to neck, medicaid renewal for more visits, gentle mobility    PT Home Exercise Plan  Access Code: [email or web address]    Consulted and Agree with Plan of Care  Patient       Patient will benefit from skilled therapeutic intervention in order to improve the following deficits and impairments:  Pain, Decreased range of motion, Decreased strength, Impaired perceived  functional ability  Visit Diagnosis: Cervicalgia  Acute bilateral low back pain without sciatica     Problem List Patient Active Problem List   Diagnosis Date Noted  . Depression, major, recurrent, moderate  ([place]) 10/01/2015    Class: Chronic  . RECTAL PAIN 01/08/2010  . TENESMUS 01/02/2010  . ANAL FISSURE 12/31/2009  . ACUTE DACRYOCYSTITIS 09/05/2009  . ACUTE SINUSITIS, UNSPECIFIED 03/11/2009    Sigurd Sos, PT 06/29/18 5:00 PM  [facility] [address] [place], [place], [address] Phone: [phone]   Fax:  [phone]  Name: Linda Mcconnell MRN: [id number] Date of Birth: 08-14-1965

## 2018-07-04 ENCOUNTER — Other Ambulatory Visit: Payer: Self-pay

## 2018-07-04 ENCOUNTER — Ambulatory Visit: Payer: No Typology Code available for payment source

## 2018-07-04 DIAGNOSIS — M542 Cervicalgia: Secondary | ICD-10-CM | POA: Diagnosis not present

## 2018-07-04 DIAGNOSIS — M545 Low back pain, unspecified: Secondary | ICD-10-CM

## 2018-07-04 NOTE — Therapy (Signed)
[facility] [address], [place], [place], [address] Phone: [phone]   Fax:  [phone]  Physical Therapy Treatment  Patient Details  Name: Linda Mcconnell MRN: [id number] Date of Birth: 01-26-1966 Referring Provider (PT): Dr. Helane Rima   Encounter Date: 07/04/2018  PT End of Session - 07/04/18 1534    Visit Number  4    Date for PT Re-Evaluation  08/19/18    Authorization Type  approved for 3 visits 2/19-3/17/20- more requested 07/04/18    Authorization - Visit Number  3    Authorization - Number of Visits  3    PT Start Time  5681    PT Stop Time  1541    PT Time Calculation (min)  57 min    Activity Tolerance  Patient limited by pain    Behavior During Therapy  [facility] for tasks assessed/performed       Past Medical History:  Diagnosis Date  . Anxiety   . Depression   . Migraine   . Tubal reversal surgical follow up     Past Surgical History:  Procedure Laterality Date  . lypoma    . reversed tubal ligation  2009  . TUBAL LIGATION      There were no vitals filed for this visit.  Subjective Assessment - 07/04/18 1448    Subjective  I took my pain medicine late last night and I didn't get up until 12:30 today.  I feel 30% better since starting PT.      Pertinent History  hx of LBP but no problem in years    Patient Stated Goals  get back to normal self; just started this career    Currently in Pain?  Yes    Pain Score  7     Pain Location  Back    Pain Orientation  Right;Left;Lower    Pain Descriptors / Indicators  Sore;Aching    Pain Type  Acute pain    Pain Onset  More than a month ago    Aggravating Factors   doing anything too long (sitting/walking/standing), driving, cooking    Pain Relieving Factors  medication, hot bath with Epsom salt    Pain Score  6    Pain Location  Neck    Pain Orientation  Right    Pain Descriptors / Indicators  Aching    Pain Type  Acute pain    Pain Onset  More  than a month ago    Pain Frequency  Constant    Aggravating Factors   turning head, sitting too long    Pain Relieving Factors  ice, medication         [facility] PT Assessment - 07/04/18 0001      Assessment   Medical Diagnosis  myalgia, neck and back pain       Prior Function   Level of Independence  Independent with basic ADLs   not doing housework    Vocation  Full time employment    Vocation Requirements  real estate      Observation/Other Assessments   Modified Oswestry  30% limitation      AROM   Cervical Flexion  50    Cervical - Right Side Bend  40    Cervical - Left Side Bend  40    Cervical - Right Rotation  80    Cervical - Left Rotation  80  [facility] PT Treatment/Exercise - 07/04/18 0001      Neck Exercises: Machines for Strengthening   Nustep  Level 2 x 10 minutes   PT present to discuss progress     Neck Exercises: Theraband   Rows  20 reps;Red    Shoulder External Rotation  20 reps;Red      Lumbar Exercises: Seated   Other Seated Lumbar Exercises  seated ball rolls forward and diagonal      Lumbar Exercises: Sidelying   Other Sidelying Lumbar Exercises  open book stretch x15 each      Moist Heat Therapy   Number Minutes Moist Heat  10 Minutes    Moist Heat Location  Lumbar Spine;Cervical               PT Short Term Goals - 07/04/18 1453      PT SHORT TERM GOAL #1   Title  The patient will report knowledge of basic self care strategies to promote healing: change of position, walking, postural support    Status  Achieved      PT SHORT TERM GOAL #2   Title  The patient will report a 30% improvement in neck and back pain     Baseline  30% reported    Status  Achieved        PT Long Term Goals - 07/04/18 1453      PT LONG TERM GOAL #1   Title  The patient will be independent in safe self progression of HEP     Baseline  independent in stretching    Time  6    Period  Weeks    Status  On-going     Target Date  08/19/18      PT LONG TERM GOAL #2   Title  The patient will report a 60% improvement in neck and back pain with home and work ADLs    Baseline  30% reduction     Time  8    Period  Weeks    Status  On-going    Target Date  08/19/18      PT LONG TERM GOAL #3   Title  The patient will have improved cervical flexion to 40 degrees, sidebending to 20 degrees and rotation to 25 degrees needed for driving    Baseline  flexion 50 degrees, sidebending 40 degrees, rotation 80 degrees bil.      Status  Achieved      PT LONG TERM GOAL #4   Title  Lumbar flexion ROM to 30, extension to 15 and sidebending to 25 degrees needed for household chores    Time  6    Period  Weeks    Status  On-going    Target Date  08/19/18      PT LONG TERM GOAL #5   Title  Modified Oswestry score improved to 20% indicating improved function with less pain     Baseline  40%     Time  6    Period  Weeks    Status  Revised    Target Date  08/19/18            Plan - 07/04/18 1512    Clinical Impression Statement  Pt reports 30% overall improveent in her symptoms since the start of care.  Her pain is worse with sitting too long and turning head to the right > left with driving. 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 Pt demonstrated improved cervical A/ROM to [facility] with pain at end range.   Pt with improved fluidity of movement.   Modified Oswestry score is 30% improved from 60% of of self perceived disability. She would benefit from PT for pain management, patient education and return to function. 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      Rehab Potential  Good    PT Frequency  2x / week    PT Duration  6 weeks    PT Treatment/Interventions  ADLs/Self Care Home Management;Electrical Stimulation;Cryotherapy;Ultrasound;Traction;Moist  Heat;Therapeutic activities;Therapeutic exercise;Patient/family education;Neuromuscular re-education;Manual techniques;Dry needling;Taping    PT Next Visit Plan  continue to advance gentle mobility    PT Home Exercise Plan  Access Code: [email or web address]    Recommended Other Services  initial cert is signed.  Recert sent 2/68/34    Consulted and Agree with Plan of Care  Patient       Patient will benefit from skilled therapeutic intervention in order to improve the following deficits and impairments:  Pain, Decreased range of motion, Decreased strength, Impaired perceived functional ability  Visit Diagnosis: Cervicalgia - Plan: PT plan of care cert/re-cert  Acute bilateral low back pain without sciatica - Plan: PT plan of care cert/re-cert     Problem List Patient Active Problem List   Diagnosis Date Noted  . Depression, major, recurrent, moderate ([place]) 10/01/2015    Class: Chronic  . RECTAL PAIN 01/08/2010  . TENESMUS 01/02/2010  . ANAL FISSURE 12/31/2009  . ACUTE DACRYOCYSTITIS 09/05/2009  . ACUTE SINUSITIS, UNSPECIFIED 03/11/2009    Sigurd Sos, PT 07/04/18 3:39 PM  [facility] [address], [place], [place], [address] Phone: [phone]   Fax:  [phone]  Name: Linda Mcconnell MRN: [id number] Date of Birth: 1965/07/18

## 2018-07-11 DIAGNOSIS — R5383 Other fatigue: Secondary | ICD-10-CM | POA: Diagnosis not present

## 2018-07-11 DIAGNOSIS — Z0000 Encounter for general adult medical examination without abnormal findings: Secondary | ICD-10-CM | POA: Diagnosis not present

## 2018-07-11 DIAGNOSIS — Z1231 Encounter for screening mammogram for malignant neoplasm of breast: Secondary | ICD-10-CM | POA: Diagnosis not present

## 2018-07-11 DIAGNOSIS — Z01419 Encounter for gynecological examination (general) (routine) without abnormal findings: Secondary | ICD-10-CM | POA: Diagnosis not present

## 2018-07-29 ENCOUNTER — Telehealth: Payer: Self-pay

## 2018-07-29 NOTE — Telephone Encounter (Signed)
PT called pt to check in due to clinic closure associated with COVID19.  Asked pt to return call at [phone]

## 2018-08-16 ENCOUNTER — Telehealth: Payer: Self-pay

## 2018-08-16 ENCOUNTER — Ambulatory Visit: Payer: Medicaid Other | Attending: Obstetrics and Gynecology

## 2018-08-16 ENCOUNTER — Other Ambulatory Visit: Payer: Self-pay

## 2018-08-16 DIAGNOSIS — M542 Cervicalgia: Secondary | ICD-10-CM | POA: Diagnosis not present

## 2018-08-16 DIAGNOSIS — M545 Low back pain, unspecified: Secondary | ICD-10-CM

## 2018-08-16 NOTE — Therapy (Signed)
[facility] [address] [place], [place], [address] Phone: [phone]   Fax:  [phone]  Physical Therapy Treatment  Patient Details  Name: Linda Mcconnell MRN: [id number] Date of Birth: 11-11-1965 Referring Provider (PT): Dr. Helane Rima   Encounter Date: 08/16/2018 Therapy Telehealth Visit:  I connected with Linda Mcconnell today at 1:15 PM by [facility] video conference and verified that I am speaking with the correct person using two identifiers.  I discussed the limitations, risks, security and privacy concerns of performing an evaluation and management service by Webex and the availability of in person appointments.  I also discussed with the patient that there may be a patient responsible charge related to this service. The patient expressed understanding and agreed to proceed.    The patient's address was confirmed.  Identified to the patient that therapist is a licensed in the state of [place].  Verified phone # as [phone] to call in case of technical difficulties.   PT End of Session - 08/16/18 1347    Visit Number  5    Authorization Type  12 visits 3/20-4/30    Authorization - Visit Number  1    Authorization - Number of Visits  12    PT Start Time  2119    PT Stop Time  1341    PT Time Calculation (min)  26 min    Activity Tolerance  Patient tolerated treatment well    Behavior During Therapy  WFL for tasks assessed/performed       Past Medical History:  Diagnosis Date  . Anxiety   . Depression   . Migraine   . Tubal reversal surgical follow up     Past Surgical History:  Procedure Laterality Date  . lypoma    . reversed tubal ligation  2009  . TUBAL LIGATION      There were no vitals filed for this visit.  Subjective Assessment - 08/16/18 1348    Subjective  I am 90% better overall.  I sometimes have pain on the Rt side of my neck.  I am limited in sitting > 1 hour.      Currently in Pain?   No/denies         [facility] PT Assessment - 08/16/18 0001      Assessment   Medical Diagnosis  myalgia, neck and back pain     Referring Provider (PT)  Dr. Helane Rima    Onset Date/Surgical Date  05/27/18      Prior Function   Level of Independence  Independent with basic ADLs   not doing housework    Vocation  Full time employment    Vocation Requirements  real estate      Observation/Other Assessments   Modified Oswestry  20% limitation      AROM   Overall AROM Comments  Full cervical A/ROM and lumbar A/ROM assessed on video.  Rt upper trap pain with Rt cervical sidebending.                            PT Education - 08/16/18 1330    Education Details   Access Code: [email or web address], how to progress exercises    Person(s) Educated  Patient    Methods  Explanation;Verbal cues;Demonstration   using Medbridge videos   Comprehension  Verbalized understanding       PT Short Term Goals - 07/04/18 1453      PT SHORT  TERM GOAL #1   Title  The patient will report knowledge of basic self care strategies to promote healing: change of position, walking, postural support    Status  Achieved      PT SHORT TERM GOAL #2   Title  The patient will report a 30% improvement in neck and back pain     Baseline  30% reported    Status  Achieved        PT Long Term Goals - 08/16/18 1318      PT LONG TERM GOAL #1   Title  The patient will be independent in safe self progression of HEP     Baseline  independent in all HEP and is walking on treadmill and riding bike    Status  Achieved      PT LONG TERM GOAL #2   Title  The patient will report a 60% improvement in neck and back pain with home and work ADLs    Baseline  90% improvement    Status  Achieved      PT LONG TERM GOAL #3   Title  The patient will have improved cervical flexion to 40 degrees, sidebending to 20 degrees and rotation to 25 degrees needed for driving    Baseline  full cervcial A/ROM assessed on telehealth  video      PT LONG TERM GOAL #4   Title  Lumbar flexion ROM to 30, extension to 15 and sidebending to 25 degrees needed for household chores    Baseline  full A/ROM without pain    Status  Achieved      PT LONG TERM GOAL #5   Title  Modified Oswestry score improved to 20% indicating improved function with less pain     Baseline  20% limitation    Status  Achieved            Plan - 08/16/18 1407    Clinical Impression Statement  Pt had a lapse in treatment due to clinic closure associated with COVID19.  Pt reports 90% overall improvement in symptoms since the start of care.  Pt demonstrates full cervical and lumbar A/ROM with pain reported in Rt UT with Rt cervical sidebending.  Pt is limited by sitting > 1 hour and performing heavy yardwork including mowing.  Oswestry Disability Index is 20% limitation today.  PT spent session reviewing all HEP and discussing activity modification as needed.  Pt verbalized understanding and is compliant in all HEP.  Pt will be discharged to HEP.      PT Next Visit Plan  D/C PT to HEP    PT Home Exercise Plan  Access Code: [email or web address]    Recommended Other Services  cert and recert signed    Consulted and Agree with Plan of Care  Patient       Patient will benefit from skilled therapeutic intervention in order to improve the following deficits and impairments:     Visit Diagnosis: Cervicalgia  Acute bilateral low back pain without sciatica     Problem List Patient Active Problem List   Diagnosis Date Noted  . Depression, major, recurrent, moderate ([place]) 10/01/2015    Class: Chronic  . RECTAL PAIN 01/08/2010  . TENESMUS 01/02/2010  . ANAL FISSURE 12/31/2009  . ACUTE DACRYOCYSTITIS 09/05/2009  . ACUTE SINUSITIS, UNSPECIFIED 03/11/2009   PHYSICAL THERAPY DISCHARGE SUMMARY  Visits from Start of Care: 5  Current functional level related to goals / functional outcomes: See above for current status.  Remaining deficits: Pt is limited  with sitting > 1 hour and has increased pain with mowing the lawn.     Education / Equipment: HEP, activity modification as needed.   Plan: Patient agrees to discharge.  Patient goals were met. Patient is being discharged due to meeting the stated rehab goals.  ?????        Sigurd Sos, PT 08/16/18 2:12 PM   [facility] [address] [place], [place], [address] Phone: [phone]   Fax:  [phone]  Name: Linda Mcconnell MRN: [id number] Date of Birth: 10/17/1965

## 2018-08-16 NOTE — Telephone Encounter (Signed)
[facility] was contacted today regarding transition if in-person OP Rehab Services to telehealth due to Covid-19. Pt consented to telehealth services, educated on MyChart signup, Webex [employer], and was agreeable to receive information via (text/email) regarding telehealth services. Pt consented and was scheduled for appointment.

## 2018-08-16 NOTE — Patient Instructions (Signed)
Access Code: [email or web address]  URL: [email or web address]  Date: 08/16/2018  Prepared by: Sigurd Sos   Exercises  Seated Cervical Rotation AROM - 10 reps - 1 sets - 1x daily - 7x weekly  Seated Neck Sidebending ROM - 10 reps - 1 sets - 1x daily - 7x weekly  Quadruped Cat Camel - 10 reps - 1 sets - 1x daily - 7x weekly  Seated Long Arc Quad - 10 reps - 1 sets - 1x daily - 7x weekly  Supine Posterior Pelvic Tilt - 10 reps - 2 sets - 1x daily - 7x weekly  Supine Double Knee to Chest - 10 reps - 3 sets - 1x daily - 7x weekly  Supine Lower Trunk Rotation - 10 reps - 3 sets - 1x daily - 7x weekly  Supine Piriformis Stretch with Foot on Ground - 2 reps - 2 sets - 20 hold - 1x daily - 7x weekly  Hooklying Gluteal Sets - 5 reps - 3 sets - 5 hold - 1x daily - 7x weekly  Hooklying Isometric Hip Flexion - 5 reps - 3 sets - 5 hold - 1x daily - 7x weekly  Hooklying Transversus Abdominis Palpation - 10 reps - 2 sets - 10 hold - 1x daily - 7x weekly  Seated Trunk Rotation - Arms Crossed - 3 reps - 1 sets - 20 hold - 3x daily - 7x weekly

## 2018-08-17 ENCOUNTER — Ambulatory Visit: Payer: Medicaid Other

## 2018-09-09 DIAGNOSIS — L290 Pruritus ani: Secondary | ICD-10-CM | POA: Diagnosis not present

## 2018-09-09 DIAGNOSIS — N390 Urinary tract infection, site not specified: Secondary | ICD-10-CM | POA: Diagnosis not present

## 2018-09-09 DIAGNOSIS — R399 Unspecified symptoms and signs involving the genitourinary system: Secondary | ICD-10-CM | POA: Diagnosis not present

## 2018-10-31 DIAGNOSIS — R3121 Asymptomatic microscopic hematuria: Secondary | ICD-10-CM | POA: Diagnosis not present

## 2018-12-13 DIAGNOSIS — H40013 Open angle with borderline findings, low risk, bilateral: Secondary | ICD-10-CM | POA: Diagnosis not present

## 2018-12-14 DIAGNOSIS — H5213 Myopia, bilateral: Secondary | ICD-10-CM | POA: Diagnosis not present

## 2018-12-29 DIAGNOSIS — H5203 Hypermetropia, bilateral: Secondary | ICD-10-CM | POA: Diagnosis not present

## 2019-03-10 ENCOUNTER — Emergency Department (HOSPITAL_BASED_OUTPATIENT_CLINIC_OR_DEPARTMENT_OTHER): Payer: No Typology Code available for payment source

## 2019-03-10 ENCOUNTER — Encounter (HOSPITAL_BASED_OUTPATIENT_CLINIC_OR_DEPARTMENT_OTHER): Payer: Self-pay | Admitting: *Deleted

## 2019-03-10 ENCOUNTER — Emergency Department (HOSPITAL_BASED_OUTPATIENT_CLINIC_OR_DEPARTMENT_OTHER)
Admission: EM | Admit: 2019-03-10 | Discharge: 2019-03-10 | Disposition: A | Discharge status: Home / Self Care | Payer: No Typology Code available for payment source | Attending: Emergency Medicine | Admitting: Emergency Medicine

## 2019-03-10 ENCOUNTER — Other Ambulatory Visit: Payer: Self-pay

## 2019-03-10 DIAGNOSIS — Z79899 Other long term (current) drug therapy: Secondary | ICD-10-CM | POA: Diagnosis not present

## 2019-03-10 DIAGNOSIS — Y999 Unspecified external cause status: Secondary | ICD-10-CM | POA: Insufficient documentation

## 2019-03-10 DIAGNOSIS — Z888 Allergy status to other drugs, medicaments and biological substances status: Secondary | ICD-10-CM | POA: Diagnosis not present

## 2019-03-10 DIAGNOSIS — S161XXA Strain of muscle, fascia and tendon at neck level, initial encounter: Secondary | ICD-10-CM | POA: Insufficient documentation

## 2019-03-10 DIAGNOSIS — Y92410 Unspecified street and highway as the place of occurrence of the external cause: Secondary | ICD-10-CM | POA: Insufficient documentation

## 2019-03-10 DIAGNOSIS — S199XXA Unspecified injury of neck, initial encounter: Secondary | ICD-10-CM | POA: Diagnosis present

## 2019-03-10 DIAGNOSIS — Y9389 Activity, other specified: Secondary | ICD-10-CM | POA: Diagnosis not present

## 2019-03-10 DIAGNOSIS — S29012A Strain of muscle and tendon of back wall of thorax, initial encounter: Secondary | ICD-10-CM | POA: Diagnosis not present

## 2019-03-10 DIAGNOSIS — M546 Pain in thoracic spine: Secondary | ICD-10-CM | POA: Diagnosis not present
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
## 2019-04-25 ENCOUNTER — Ambulatory Visit: Payer: Medicaid Other | Attending: Internal Medicine

## 2019-04-25 DIAGNOSIS — Z20822 Contact with and (suspected) exposure to covid-19: Secondary | ICD-10-CM | POA: Diagnosis not present

## 2019-04-28 LAB — NOVEL CORONAVIRUS, NAA: SARS-CoV-2, NAA: NOT DETECTED

## 2019-08-08 DIAGNOSIS — Z1231 Encounter for screening mammogram for malignant neoplasm of breast: Secondary | ICD-10-CM | POA: Diagnosis not present

## 2019-08-08 DIAGNOSIS — Z0000 Encounter for general adult medical examination without abnormal findings: Secondary | ICD-10-CM | POA: Diagnosis not present

## 2019-08-08 DIAGNOSIS — Z01419 Encounter for gynecological examination (general) (routine) without abnormal findings: Secondary | ICD-10-CM | POA: Diagnosis not present

## 2019-08-14 ENCOUNTER — Emergency Department (HOSPITAL_BASED_OUTPATIENT_CLINIC_OR_DEPARTMENT_OTHER): Payer: Medicaid Other

## 2019-08-14 ENCOUNTER — Other Ambulatory Visit: Payer: Self-pay

## 2019-08-14 ENCOUNTER — Emergency Department (HOSPITAL_BASED_OUTPATIENT_CLINIC_OR_DEPARTMENT_OTHER)
Admission: EM | Admit: 2019-08-14 | Discharge: 2019-08-14 | Disposition: A | Discharge status: Home / Self Care | Payer: Medicaid Other | Attending: Emergency Medicine | Admitting: Emergency Medicine

## 2019-08-14 ENCOUNTER — Encounter (HOSPITAL_BASED_OUTPATIENT_CLINIC_OR_DEPARTMENT_OTHER): Payer: Self-pay

## 2019-08-14 DIAGNOSIS — T594X1A Toxic effect of chlorine gas, accidental (unintentional), initial encounter: Secondary | ICD-10-CM | POA: Diagnosis not present

## 2019-08-14 DIAGNOSIS — R05 Cough: Secondary | ICD-10-CM | POA: Diagnosis not present

## 2019-08-14 DIAGNOSIS — R059 Cough, unspecified: Secondary | ICD-10-CM

## 2019-08-14 DIAGNOSIS — J698 Pneumonitis due to inhalation of other solids and liquids: Secondary | ICD-10-CM | POA: Diagnosis not present

## 2019-08-14 DIAGNOSIS — R0789 Other chest pain: Secondary | ICD-10-CM | POA: Insufficient documentation

## 2019-08-14 MED ORDER — BENZONATATE 100 MG PO CAPS: 100.0000 mg | ORAL_CAPSULE | Freq: Three times a day (TID) | ORAL | 0 refills | Status: DC

## 2019-08-14 MED ORDER — PREDNISONE 50 MG PO TABS: 50.0000 mg | ORAL_TABLET | Freq: Every day | ORAL | 0 refills | Status: DC

## 2019-08-14 NOTE — ED Provider Notes (Signed)
[facility] EMERGENCY DEPARTMENT Provider Note   CSN: [id number] Arrival date & time: 08/14/19  1704    History Chief Complaint  Patient presents with  . Toxic Inhalation   Linda Mcconnell is a 54 y.o. female with past medical history significant for migraine, depression, anxiety who presents for evaluation of cough.  Patient states cough began yesterday after being in enclosed room and using Clorox bleach.  Denies any splashing of the septum to her face.  She has had persistent cough.  She now has chest pain with coughing.  Chest pain does not radiate to her left arm, left jaw.  No associated diaphoresis, lightheadedness, dizziness.  She has never had any exertional or pleuritic chest pain previously.  No unilateral leg swelling, redness, warmth.  No recent surgery, malignancy, DVT, PE or clotting disorder.  Has had some tearing to her bilateral eyes which started this afternoon. Did not have any eye complaints yesterday when exposure occurred. Also some clear rhinorrhea.  She called her OB/GYN today who told her to proceed to the emergency department for evaluation.  Denies additional aggravating relieving factors.  Denies fever, chills, nausea, vomiting, headache, lightheadedness, dizziness, abdominal pain, diarrhea, dysuria.  Denies sensation of throat closing, rashes or lesions.  Denies prior similar symptoms.   History obtained from patient and past medical records.  No interpreter is used.  HPI     Past Medical History:  Diagnosis Date  . Anxiety   . Depression   . Migraine   . Tubal reversal surgical follow up     Patient Active Problem List   Diagnosis Date Noted  . Depression, major, recurrent, moderate ([place]) 10/01/2015    Class: Chronic  . RECTAL PAIN 01/08/2010  . TENESMUS 01/02/2010  . ANAL FISSURE 12/31/2009  . ACUTE DACRYOCYSTITIS 09/05/2009  . ACUTE SINUSITIS, UNSPECIFIED 03/11/2009    Past Surgical History:  Procedure Laterality Date  . lypoma      . reversed tubal ligation  2009  . TUBAL LIGATION       OB History   No obstetric history on file.     Family History  Problem Relation Age of Onset  . Depression Sister   . Anxiety disorder Sister   . Anxiety disorder Brother   . Depression Brother   . Anxiety disorder Sister   . Depression Sister   . Anxiety disorder Sister   . Depression Sister   . Anxiety disorder Brother   . Depression Brother   . Anxiety disorder Brother   . Depression Brother   . Diabetes Other        family hx  . Alcohol abuse Mother   . Depression Mother     Social History   Tobacco Use  . Smoking status: Never Smoker  . Smokeless tobacco: Never Used  Substance Use Topics  . Alcohol use: Yes    Alcohol/week: 0.0 standard drinks    Comment: occ  . Drug use: No    Home Medications Prior to Admission medications   Medication Sig Start Date End Date Taking? Authorizing Provider  benzonatate (TESSALON) 100 MG capsule Take 1 capsule (100 mg total) by mouth every 8 (eight) hours. 08/14/19   Bearett Porcaro A, PA-C  predniSONE (DELTASONE) 50 MG tablet Take 1 tablet (50 mg total) by mouth daily. 08/14/19   Monita Swier A, PA-C    Allergies    Other  Review of Systems   Review of Systems  Constitutional: Negative.   HENT: Positive  for rhinorrhea. Negative for congestion, dental problem, ear discharge, ear pain, facial swelling, nosebleeds, postnasal drip, sore throat, trouble swallowing and voice change.   Eyes: Positive for discharge (Tearing to bilateral eyes). Negative for photophobia, pain, redness, itching and visual disturbance.  Respiratory: Positive for cough. Negative for apnea, choking, chest tightness, shortness of breath, wheezing and stridor.   Cardiovascular: Positive for chest pain (With cough). Negative for palpitations and leg swelling.  Gastrointestinal: Negative.   Genitourinary: Negative.   Neurological: Negative.   All other systems reviewed and are  negative.   Physical Exam Updated Vital Signs BP 112/81 (BP Location: Left Arm)   Pulse 87   Temp 98.1 F (36.7 C) (Oral)   Resp 18   Ht 5\' 4"  (1.626 m)   Wt 68 kg   LMP 07/19/2019   SpO2 99%   BMI 25.75 kg/m   Physical Exam Vitals and nursing note reviewed.  Constitutional:      General: She is not in acute distress.    Appearance: She is well-developed. She is not ill-appearing, toxic-appearing or diaphoretic.  HENT:     Head: Normocephalic and atraumatic.     Nose: Nose normal.     Mouth/Throat:     Mouth: Mucous membranes are moist.     Pharynx: Oropharynx is clear.  Eyes:     General: Lids are normal. Vision grossly intact.     Extraocular Movements: Extraocular movements intact.     Conjunctiva/sclera: Conjunctivae normal.     Pupils: Pupils are equal, round, and reactive to light.  Cardiovascular:     Rate and Rhythm: Normal rate.     Pulses: Normal pulses.          Radial pulses are 2+ on the right side and 2+ on the left side.       Posterior tibial pulses are 2+ on the right side and 2+ on the left side.     Heart sounds: Normal heart sounds.  Pulmonary:     Effort: Pulmonary effort is normal. No respiratory distress.     Breath sounds: Normal breath sounds and air entry.     Comments: Clear to auscultation bilateral wheeze, rhonchi or rales.  No accessory muscle usage.  Speaks in full sentences without difficulty. Abdominal:     General: Bowel sounds are normal. There is no distension.     Tenderness: There is no abdominal tenderness. There is no right CVA tenderness, left CVA tenderness, guarding or rebound.     Hernia: No hernia is present.  Musculoskeletal:        General: Normal range of motion.     Cervical back: Normal range of motion.     Comments: Moves all 4 extremities at difficulty.  Bevelyn Buckles' sign negative  Skin:    General: Skin is warm and dry.     Capillary Refill: Capillary refill takes less than 2 seconds.     Comments: Brisk capillary  refill.  No edema, erythema or warmth  Neurological:     Mental Status: She is alert.     Cranial Nerves: Cranial nerves are intact.     Comments: Ambulatory without ataxic gait    ED Results / Procedures / Treatments   Labs (all labs ordered are listed, but only abnormal results are displayed) Labs Reviewed - No data to display  EKG EKG Interpretation  Date/Time:  Monday August 14 2019 18:32:37 EDT Ventricular Rate:  80 PR Interval:  146 QRS Duration: 76 QT Interval:  364  QTC Calculation: 419 R Axis:   73 Text Interpretation: Normal sinus rhythm Normal ECG No significant change since 4/18 Confirmed by Aletta Edouard [phone]) on 08/14/2019 6:40:59 PM   Radiology DG Chest 2 View  Result Date: 08/14/2019 CLINICAL DATA:  Pt c/o cough, CP with cough, eyes burning/watery-sx started yesterday after using clorox in enclosed area EXAM: CHEST - 2 VIEW COMPARISON:  Chest radiograph 07/08/2012 FINDINGS: The heart size and mediastinal contours are within normal limits. The lungs are clear. No pneumothorax or pleural effusion. The visualized skeletal structures are unremarkable. IMPRESSION: No acute cardiopulmonary process. Electronically Signed   By: Audie Pinto M.D.   On: 08/14/2019 17:32   Procedures Procedures (including critical care time)  Medications Ordered in ED Medications - No data to display  ED Course  I have reviewed the triage vital signs and the nursing notes.  Pertinent labs & imaging results that were available during my care of the patient were reviewed by me and considered in my medical decision making (see chart for details).  46 old female appears otherwise well presents for evaluation of cough and chest pain with cough.  Had exposure to Clorox yesterday in an enclosed room.  Denies recent Covid exposures.  Cough nonproductive.  No exertional or pleuritic chest pain.  Bevelyn Buckles' sign negative, no evidence of DVT on exam.  No associated diaphoresis, nausea, vomiting.   Mild clear rhinorrhea.  Today began having some clear tearing to bilateral eyes however no vision changes, eye pain.  Lungs clear.  Abdomen soft.  Low suspicion for ACS, PE, dissection.  Chest x-ray obtained by triage which I personally reviewed and interpreted.  Does not show any evidence of infiltrates, cardiomegaly, pulmonary edema, pneumothorax.  Patient is without tachycardia, tachypnea or hypoxia.  She is PERC negative. EKG without STEMI. Declined eye exam. States "no problems with eyes."  Discussed symptomatic management at home. Patient to return for new or worsening symptoms. Likely irritation for inhalation.  Low suspicion for acute bacterial etiology of symptoms.  The patient has been appropriately medically screened and/or stabilized in the ED. I have low suspicion for any other emergent medical condition which would require further screening, evaluation or treatment in the ED or require inpatient management.  Patient is hemodynamically stable and in no acute distress.  Patient able to ambulate in department prior to ED.  Evaluation does not show acute pathology that would require ongoing or additional emergent interventions while in the emergency department or further inpatient treatment.  I have discussed the diagnosis with the patient and answered all questions.  Pain is been managed while in the emergency department and patient has no further complaints prior to discharge.  Patient is comfortable with plan discussed in room and is stable for discharge at this time.  I have discussed strict return precautions for returning to the emergency department.  Patient was encouraged to follow-up with PCP/specialist refer to at discharge.    MDM Rules/Calculators/A&P                      RICCI SHONG was evaluated in Emergency Department on 08/14/2019 for the symptoms described in the history of present illness. She was evaluated in the context of the global COVID-19 pandemic, which necessitated  consideration that the patient might be at risk for infection with the SARS-CoV-2 virus that causes COVID-19. Institutional protocols and algorithms that pertain to the evaluation of patients at risk for COVID-19 are in a state of rapid change  based on information released by regulatory bodies including the CDC and federal and state organizations. These policies and algorithms were followed during the patient's care in the ED. Final Clinical Impression(s) / ED Diagnoses Final diagnoses:  Cough  Pneumonitis due to inhalation of other solids and liquids ([place])    Rx / DC Orders ED Discharge Orders         Ordered    benzonatate (TESSALON) 100 MG capsule  Every 8 hours     08/14/19 1843    predniSONE (DELTASONE) 50 MG tablet  Daily     08/14/19 1843           Camarion Weier A, PA-C 08/14/19 1847    Hayden Rasmussen, MD 08/15/19 1048

## 2019-08-14 NOTE — Discharge Instructions (Addendum)
Take the medications as prescribed  Return for new or worsening symptoms 

## 2019-08-14 NOTE — ED Triage Notes (Signed)
Pt c/o cough, CP with cough, eyes burning/watery-sx started yesterday after using clorox in enclosed area-was advised by GYN to come to ED "to get a CXR"-NAD-steady gait

## 2020-02-12 DIAGNOSIS — R799 Abnormal finding of blood chemistry, unspecified: Secondary | ICD-10-CM | POA: Diagnosis not present

## 2020-03-03 ENCOUNTER — Encounter (HOSPITAL_BASED_OUTPATIENT_CLINIC_OR_DEPARTMENT_OTHER): Payer: Self-pay | Admitting: Emergency Medicine

## 2020-03-03 ENCOUNTER — Other Ambulatory Visit: Payer: Self-pay

## 2020-03-03 ENCOUNTER — Emergency Department (HOSPITAL_BASED_OUTPATIENT_CLINIC_OR_DEPARTMENT_OTHER): Payer: Medicaid Other

## 2020-03-03 ENCOUNTER — Emergency Department (HOSPITAL_BASED_OUTPATIENT_CLINIC_OR_DEPARTMENT_OTHER)
Admission: EM | Admit: 2020-03-03 | Discharge: 2020-03-03 | Disposition: A | Discharge status: Home / Self Care | Payer: Medicaid Other | Attending: Emergency Medicine | Admitting: Emergency Medicine

## 2020-03-03 DIAGNOSIS — R1111 Vomiting without nausea: Secondary | ICD-10-CM | POA: Diagnosis not present

## 2020-03-03 DIAGNOSIS — R300 Dysuria: Secondary | ICD-10-CM | POA: Diagnosis not present

## 2020-03-03 DIAGNOSIS — R399 Unspecified symptoms and signs involving the genitourinary system: Secondary | ICD-10-CM | POA: Diagnosis present

## 2020-03-03 DIAGNOSIS — K8689 Other specified diseases of pancreas: Secondary | ICD-10-CM | POA: Diagnosis not present

## 2020-03-03 DIAGNOSIS — N1330 Unspecified hydronephrosis: Secondary | ICD-10-CM | POA: Diagnosis not present

## 2020-03-03 DIAGNOSIS — D259 Leiomyoma of uterus, unspecified: Secondary | ICD-10-CM | POA: Diagnosis not present

## 2020-03-03 HISTORY — DX: Other microscopic hematuria: R31.29

## 2020-03-03 LAB — URINALYSIS, ROUTINE W REFLEX MICROSCOPIC
Bilirubin Urine: NEGATIVE
Glucose, UA: NEGATIVE mg/dL
Ketones, ur: NEGATIVE mg/dL
Leukocytes,Ua: NEGATIVE
Nitrite: NEGATIVE
Protein, ur: NEGATIVE mg/dL
Specific Gravity, Urine: >1.03 — ABNORMAL HIGH (ref 1.005–1.030)
pH: 6 (ref 5.0–8.0)

## 2020-03-03 LAB — URINALYSIS, MICROSCOPIC (REFLEX)

## 2020-03-03 LAB — PREGNANCY, URINE: Preg Test, Ur: NEGATIVE

## 2020-03-03 MED ORDER — CEPHALEXIN 500 MG PO CAPS: 500.0000 mg | ORAL_CAPSULE | Freq: Two times a day (BID) | ORAL | 0 refills | Status: AC

## 2020-03-03 MED ORDER — ONDANSETRON 4 MG PO TBDP
8.0000 mg | ORAL_TABLET | Freq: Once | ORAL | Status: AC
  Administered 2020-03-03: 8 mg via ORAL
  Filled 2020-03-03: qty 2

## 2020-03-03 NOTE — Discharge Instructions (Addendum)
Your CT scan today showed that you have hydronephrosis in your right kidney.  I included information to read over.  You should follow up with a urologist for this issue.  Please call Monday to make an appointment in the office with them.  We decided to treat you symptomatically for a urine infection with 5 days of Keflex.   We also talked about the CT report showing a cyst, or mass at the head of your pancreas.  We recommend that you get an MRI of the abdomen with contrast to better look at this mass.  This is most likely a benign finding (not life threatening), but it's important to rule out the possibility of pancreatic cancer.  Please talk to your primary care doctor about arranging for this scan as an outpatient.  I included a copy of your CT report to bring with you to the office if needed.

## 2020-03-03 NOTE — ED Provider Notes (Signed)
[facility] DEPT Provider Note: Georgena Spurling, MD, FACEP  CSN: [id number] MRN: [id number] ARRIVAL: 03/03/20 at [place]: [place]  urinary symptoms   HISTORY OF PRESENT ILLNESS  03/03/20 5:30 AM Linda Mcconnell is a 54 y.o. female with about 8 hours of "feeling bad".  Specifically she has had urinary urgency and frequency but not passing a large amount of urine.  She has been nauseated and vomited once on arrival to the ED.  She has not had a fever or chills.  She is having discomfort in her suprapubic region that is not worse with palpation.  She has also had low back pain radiating to her left leg for about the past month.  She rates her pain as an 8 out of 10, worse with movement or sitting.   Past Medical History:  Diagnosis Date  . Anxiety   . Depression   . Microscopic hematuria   . Migraine     Past Surgical History:  Procedure Laterality Date  . lypoma    . reversed tubal ligation  2009  . TUBAL LIGATION      Family History  Problem Relation Age of Onset  . Depression Sister   . Anxiety disorder Sister   . Anxiety disorder Brother   . Depression Brother   . Anxiety disorder Sister   . Depression Sister   . Anxiety disorder Sister   . Depression Sister   . Anxiety disorder Brother   . Depression Brother   . Anxiety disorder Brother   . Depression Brother   . Diabetes Other        family hx  . Alcohol abuse Mother   . Depression Mother     Social History   Tobacco Use  . Smoking status: Never Smoker  . Smokeless tobacco: Never Used  Vaping Use  . Vaping Use: Never used  Substance Use Topics  . Alcohol use: Yes    Alcohol/week: 0.0 standard drinks    Comment: occ  . Drug use: No    Prior to Admission medications   Medication Sig Start Date End Date Taking? Authorizing Provider  cephALEXin (KEFLEX) 500 MG capsule Take 1 capsule (500 mg total) by mouth 2 (two) times daily for 5 days. 03/03/20 03/08/20  Wyvonnia Dusky, MD    Allergies Other   REVIEW OF SYSTEMS  Negative except as noted here or in the History of Present Illness.   PHYSICAL EXAMINATION  Initial Vital Signs Blood pressure (!) 154/97, pulse 87, temperature 98.2 F (36.8 C), temperature source Oral, resp. rate 20, height 5\' 3"  (1.6 m), weight 63.5 kg, SpO2 100 %.  Examination General: Well-developed, well-nourished female in no acute distress; appearance consistent with age of record HENT: normocephalic; atraumatic Eyes: pupils equal, round and reactive to light; extraocular muscles intact Neck: supple Heart: regular rate and rhythm Lungs: clear to auscultation bilaterally Abdomen: soft; nondistended; nontender; bowel sounds present Extremities: No deformity; full range of motion; pulses normal Neurologic: Awake, alert and oriented; motor function intact in all extremities and symmetric; no facial droop Skin: Warm and dry Psychiatric: Normal mood and affect   RESULTS  Summary of this visit's results, reviewed and interpreted by myself:   EKG Interpretation  Date/Time:    Ventricular Rate:    PR Interval:    QRS Duration:   QT Interval:    QTC Calculation:   R Axis:     Text Interpretation:  Laboratory Studies: Results for orders placed or performed during the hospital encounter of 03/03/20 (from the past 24 hour(s))  Urinalysis, Routine w reflex microscopic     Status: Abnormal   Collection Time: 03/03/20  5:00 AM  Result Value Ref Range   Color, Urine YELLOW YELLOW   APPearance CLEAR CLEAR   Specific Gravity, Urine >1.030 (H) 1.005 - 1.030   pH 6.0 5.0 - 8.0   Glucose, UA NEGATIVE NEGATIVE mg/dL   Hgb urine dipstick LARGE (A) NEGATIVE   Bilirubin Urine NEGATIVE NEGATIVE   Ketones, ur NEGATIVE NEGATIVE mg/dL   Protein, ur NEGATIVE NEGATIVE mg/dL   Nitrite NEGATIVE NEGATIVE   Leukocytes,Ua NEGATIVE NEGATIVE  Urinalysis, Microscopic (reflex)     Status: Abnormal   Collection Time: 03/03/20  5:00 AM    Result Value Ref Range   RBC / HPF 6-10 0 - 5 RBC/hpf   WBC, UA 0-5 0 - 5 WBC/hpf   Bacteria, UA FEW (A) NONE SEEN   Squamous Epithelial / LPF 0-5 0 - 5   Mucus PRESENT   Pregnancy, urine     Status: None   Collection Time: 03/03/20  5:00 AM  Result Value Ref Range   Preg Test, Ur NEGATIVE NEGATIVE   Imaging Studies: CT Renal Stone Study  Result Date: 03/03/2020 CLINICAL DATA:  Lower abdominal pain and vomiting. EXAM: CT ABDOMEN AND PELVIS WITHOUT CONTRAST TECHNIQUE: Multidetector CT imaging of the abdomen and pelvis was performed following the standard protocol without IV contrast. COMPARISON:  12/31/2006 FINDINGS: Lower chest: Unremarkable Hepatobiliary: No focal abnormality in the liver on this study without intravenous contrast. There is no evidence for gallstones, gallbladder wall thickening, or pericholecystic fluid. No intrahepatic or extrahepatic biliary dilation. Pancreas: 10 mm hypoattenuating lesion identified in the anterior pancreatic head (image 26/2). No dilatation of the main duct. Spleen: No splenomegaly. No focal mass lesion. Adrenals/Urinary Tract: Bilateral adrenal thickening without nodule or mass lesion. Left kidney and ureter unremarkable. There is mild right hydroureteronephrosis with ureteral distention down to the right pelvic sidewall/adnexal region. No obstructing stone or mass lesion evident. The urinary bladder appears normal for the degree of distention. Stomach/Bowel: Stomach is unremarkable. No gastric wall thickening. No evidence of outlet obstruction. Duodenum is normally positioned as is the ligament of Treitz. No small bowel wall thickening. No small bowel dilatation. The terminal ileum is normal. The appendix is normal. No gross colonic mass. No colonic wall thickening. Vascular/Lymphatic: No abdominal aortic aneurysm. No abdominal aortic atherosclerotic calcification. There is no gastrohepatic or hepatoduodenal ligament lymphadenopathy. No retroperitoneal or  mesenteric lymphadenopathy. No pelvic sidewall lymphadenopathy. Reproductive: Uterus appears enlarged and lobular, likely related to fibroid changes. Adnexal regions not well demonstrated on noncontrast CT with possible 2.2 cm cystic lesion in the left ovary. Other: Trace free fluid in the cul-de-sac. Musculoskeletal: No worrisome lytic or sclerotic osseous abnormality. IMPRESSION: 1. Mild right hydroureteronephrosis with right ureteral distension extending down to the right pelvic sidewall/adnexal region. No obstructing stone or mass lesion evident on this noncontrast CT. Dedicated follow-up hematuria protocol CT may prove helpful to further evaluate. 2. 10 mm hypoattenuating lesion in the anterior pancreatic head, new since 2008 exam. This may be a cyst/pseudocyst or even, potentially, a duodenal diverticulum (less likely). Dedicated abdominal MRI with and without contrast recommended to exclude neoplasm. 3. Uterine fibroid changes. 4. Trace free fluid in the cul-de-sac. Electronically Signed   By: Misty Stanley M.D.   On: 03/03/2020 07:05    ED COURSE and MDM  Nursing notes, initial and subsequent vitals signs, including pulse oximetry, reviewed and interpreted by myself.  Vitals:   03/03/20 0510 03/03/20 0656  BP: (!) 154/97 124/69  Pulse: 87 73  Resp: 20 16  Temp: 98.2 F (36.8 C)   TempSrc: Oral   SpO2: 100% 100%  Weight: 63.5 kg   Height: 5\' 3"  (1.6 m)    Medications  ondansetron (ZOFRAN-ODT) disintegrating tablet 8 mg (8 mg Oral Given 03/03/20 0541)   7:00 AM CT results pending. Signed out to Dr. Langston Masker.   PROCEDURES  Procedures   ED DIAGNOSES     ICD-10-CM   1. Dysuria  R30.0   2. Hydronephrosis, unspecified hydronephrosis type  N13.30   3. Pancreatic mass  K86.89        Shanon Rosser, MD 03/03/20 2229

## 2020-03-03 NOTE — ED Provider Notes (Signed)
Patient updated about CT report.  Will f/u with urology for hydronephrosis, and talk to PCP about MRI abdomen for pancreatic head mass.  She reports her dysuria feels like prior UTI's which resolved with antibiotics.  I think it's reasonable to treat with 5 days of keflex.  Urine culture sent.  Okay for discharge.   Wyvonnia Dusky, MD 03/03/20 [phone]

## 2020-03-03 NOTE — ED Notes (Signed)
Pt discharged to home. Discharge instructions have been discussed with patient and/or family members. Pt verbally acknowledges understanding d/c instructions, and endorses comprehension to checkout at registration before leaving.  °

## 2020-03-03 NOTE — ED Triage Notes (Addendum)
C/o bilateral lower back pain for the past month. States she has urgency, but is not urinating a lot. C/o nausea and vomiting times one, which was on arrival to the ED. Denies hx of kidney stones, but pt is pacing in the room.

## 2020-03-03 NOTE — ED Notes (Signed)
Pt to CT

## 2020-03-04 ENCOUNTER — Ambulatory Visit: Payer: Medicaid Other | Admitting: Family Medicine

## 2020-03-04 LAB — URINE CULTURE: Culture: <10000 — AB

## 2020-03-06 DIAGNOSIS — N130 Hydronephrosis with ureteropelvic junction obstruction: Secondary | ICD-10-CM | POA: Diagnosis not present

## 2020-03-06 DIAGNOSIS — R3121 Asymptomatic microscopic hematuria: Secondary | ICD-10-CM | POA: Diagnosis not present

## 2020-03-08 DIAGNOSIS — D259 Leiomyoma of uterus, unspecified: Secondary | ICD-10-CM | POA: Diagnosis not present

## 2020-03-08 DIAGNOSIS — R1032 Left lower quadrant pain: Secondary | ICD-10-CM | POA: Diagnosis not present

## 2020-03-08 DIAGNOSIS — K8689 Other specified diseases of pancreas: Secondary | ICD-10-CM | POA: Diagnosis not present

## 2020-03-13 ENCOUNTER — Other Ambulatory Visit: Payer: Self-pay

## 2020-03-13 ENCOUNTER — Encounter: Payer: Self-pay | Admitting: Family Medicine

## 2020-03-13 ENCOUNTER — Ambulatory Visit (INDEPENDENT_AMBULATORY_CARE_PROVIDER_SITE_OTHER): Payer: Medicaid Other | Admitting: Family Medicine

## 2020-03-13 VITALS — BP 116/70 | HR 93 | Temp 98.5°F | Ht 63.0 in | Wt 153.2 lb

## 2020-03-13 DIAGNOSIS — H6983 Other specified disorders of Eustachian tube, bilateral: Secondary | ICD-10-CM

## 2020-03-13 NOTE — Progress Notes (Signed)
   Subjective:    Patient ID: Linda Mcconnell, female    DOB: 1966/04/05, 54 y.o.   MRN: [id number]  HPI Here for one week of feeling pressure in both ears with decreased hearing. No pain. No other symptoms.    Review of Systems  Constitutional: Negative.   HENT: Positive for congestion and hearing loss. Negative for ear discharge, ear pain, facial swelling, postnasal drip, sinus pain and sore throat.   Eyes: Negative.   Respiratory: Negative.        Objective:   Physical Exam Constitutional:      Appearance: Normal appearance. She is not ill-appearing.  HENT:     Right Ear: Tympanic membrane, ear canal and external ear normal.     Left Ear: Tympanic membrane, ear canal and external ear normal.     Nose: Nose normal.     Mouth/Throat:     Pharynx: Oropharynx is clear.  Eyes:     Conjunctiva/sclera: Conjunctivae normal.  Pulmonary:     Effort: Pulmonary effort is normal.     Breath sounds: Normal breath sounds.  Lymphadenopathy:     Cervical: No cervical adenopathy.  Neurological:     Mental Status: She is alert.           Assessment & Plan:  Eustachian tube dysfunction. Try Claritin and Flonase daily.  Alysia Penna, MD

## 2020-03-18 ENCOUNTER — Telehealth: Payer: Self-pay | Admitting: Internal Medicine

## 2020-03-18 NOTE — Telephone Encounter (Signed)
Routine, non urgent, office appointment with me is fine. Thanks

## 2020-03-18 NOTE — Telephone Encounter (Signed)
Hi Dr. Henrene Pastor, we have received a referral from patient's Obgyn for pancreatic mass.  Patient saw you in 2011.  Records will be sent to you for review.  Please advise on scheduling, thank you.

## 2020-03-22 DIAGNOSIS — N281 Cyst of kidney, acquired: Secondary | ICD-10-CM | POA: Diagnosis not present

## 2020-03-22 DIAGNOSIS — N1330 Unspecified hydronephrosis: Secondary | ICD-10-CM | POA: Diagnosis not present

## 2020-03-22 DIAGNOSIS — D252 Subserosal leiomyoma of uterus: Secondary | ICD-10-CM | POA: Diagnosis not present

## 2020-03-22 NOTE — Telephone Encounter (Signed)
Spoke with patient, she will call back to schedule appointment

## 2020-04-22 DIAGNOSIS — R3121 Asymptomatic microscopic hematuria: Secondary | ICD-10-CM | POA: Diagnosis not present

## 2020-05-23 ENCOUNTER — Encounter: Payer: Self-pay | Admitting: Internal Medicine

## 2020-05-23 ENCOUNTER — Ambulatory Visit: Payer: Medicaid Other | Admitting: Internal Medicine

## 2020-05-23 ENCOUNTER — Other Ambulatory Visit: Payer: Self-pay

## 2020-05-23 VITALS — BP 110/66 | HR 67 | Ht 63.0 in | Wt 156.0 lb

## 2020-05-23 DIAGNOSIS — K862 Cyst of pancreas: Secondary | ICD-10-CM

## 2020-05-23 DIAGNOSIS — Z1211 Encounter for screening for malignant neoplasm of colon: Secondary | ICD-10-CM

## 2020-05-23 NOTE — Progress Notes (Signed)
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  Has not been seen since September 2011 when she underwent complete colonoscopy for the purposes of screening and to evaluate complaints of rectal pain.  Colonoscopy was normal.  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  She reports a 6 or 7-week history of lower abdominal discomfort.  Noticeable with standing and walking.  She seemed to have some trouble with urination.  She does have a history of microscopic hematuria.  She presented to the emergency room March 03, 2020 for evaluation.  Noncontrast CT was performed revealed mild right-sided hydronephrosis.  In addition the examination suggested 1 cm hypoattenuating abnormality of the pancreas, in the head..  Thus, she subsequently underwent a CT scan of the abdomen pelvis with and without contrast on March 22, 2020.  There was a 1 cm anterior pancreatic head cystic lesion without high risk CT features.  No other pancreatic abnormalities.  MRI of the pancreas with and without IV contrast in 12 months was recommended.  Other incidental findings as noted.  Patient tells me that she has a good appetite.  Stable weight.  Normal regular bowel habits.  No bleeding.  No family history of pancreatic cancer or personal history of pancreatitis.  Review of outside blood work from February 12, 2020 revealed hemoglobin of 11.6.  Normal liver tests.  There is no family history of colon cancer.  She has completed her COVID vaccination series  REVIEW OF SYSTEMS:  All non-GI ROS negative unless otherwise stated in the HPI except for menstrual pain, headaches, microscopic hematuria  Past Medical History:  Diagnosis Date  . Anxiety   . Depression   . Microscopic hematuria   . Migraine     Past Surgical History:  Procedure Laterality  Date  . lypoma    . reversed tubal ligation  2009  . TUBAL LIGATION      Social History Linda Mcconnell  reports that she has never smoked. She has never used smokeless tobacco. She reports current alcohol use. She reports that she does not use drugs.  family history includes Alcohol abuse in her mother; Anxiety disorder in her brother, brother, brother, sister, sister, and sister; Depression in her brother, brother, brother, mother, sister, sister, and sister; Diabetes in an other family member.  Allergies  Allergen Reactions  . Other     "cocktail for a HA", Toradol/Dexomethasone.       PHYSICAL EXAMINATION: Vital signs: BP 110/66   Pulse 67   Ht 5\' 3"  (1.6 m)   Wt 156 lb (70.8 kg)   BMI 27.63 kg/m   Constitutional: generally well-appearing, no acute distress Psychiatric: alert and oriented x3, cooperative Eyes: extraocular movements intact, anicteric, conjunctiva pink Mouth: oral pharynx moist, no lesions Neck: supple no lymphadenopathy Cardiovascular: heart regular rate and rhythm, no murmur Lungs: clear to auscultation bilaterally Abdomen: soft, nontender, nondistended, no obvious ascites, no peritoneal signs, normal bowel sounds, no organomegaly Rectal: Deferred until colonoscopy Extremities: no clubbing, cyanosis, or lower extremity edema bilaterally Skin: no lesions on visible extremities Neuro: No focal deficits.  Cranial nerves intact  ASSESSMENT:  1.  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  No relevant clinical issues. 2.  Problems with abdominal discomfort as described.  Transient hydroureter history of microscopic colitis.  As well  urinary complaints at the time.  Suspect urologic.  No other problems found with imaging or laboratories 3.  Normal colonoscopy September 2011.  Due for follow-up   PLAN:  1.  Plan for MRI of the pancreas in 1 year.  Patient is aware.  CMA was asked to list computer as a recall for notification 2.   Schedule colonoscopy for the purposes of screening.  Also to be reassured that her abdominal complaints are chronic in nature.The nature of the procedure, as well as the risks, benefits, and alternatives were carefully and thoroughly reviewed with the patient. Ample time for discussion and questions allowed. The patient understood, was satisfied, and agreed to proceed. 3.  Ongoing general medical care with PCP and oncologist. A total time of 45 minutes was spent preparing to see the patient, reviewing outside evaluations, test, x-rays.  Obtaining comprehensive history and performing comprehensive physical exam.  Counseled patient regarding her above listed issues.  Ordering advanced radiology and endoscopic procedures.  Finally, documenting clinical information in the health record

## 2020-05-23 NOTE — Patient Instructions (Signed)
You will be due for an MRI in a year.  I will call you to schedule this.  I will call you in the next week to schedule a morning colonoscopy

## 2020-05-28 ENCOUNTER — Encounter: Payer: Self-pay | Admitting: Internal Medicine

## 2020-07-18 ENCOUNTER — Telehealth: Payer: Self-pay

## 2020-07-18 NOTE — Telephone Encounter (Signed)
Mailed colonoscopy instructions to patient

## 2020-08-06 ENCOUNTER — Encounter: Payer: Medicaid Other | Admitting: Internal Medicine

## 2020-11-06 ENCOUNTER — Encounter: Payer: Self-pay | Admitting: Internal Medicine

## 2020-11-06 ENCOUNTER — Telehealth: Payer: Self-pay | Admitting: Internal Medicine

## 2020-11-06 ENCOUNTER — Encounter: Payer: Medicaid Other | Admitting: Internal Medicine

## 2020-11-06 NOTE — Telephone Encounter (Signed)
Called patient at 2:45 pm to check if she would be coming for her procedure this afternoon at 3:30pm.  She stated she had forgotten due to being ill the last couple of weeks and was trying to check on everything and she missed that she had this procedure scheduled.  She rescheduled for February 05, 2021.

## 2021-01-22 ENCOUNTER — Other Ambulatory Visit: Payer: Self-pay

## 2021-01-22 ENCOUNTER — Ambulatory Visit (AMBULATORY_SURGERY_CENTER): Payer: Medicaid Other

## 2021-01-22 VITALS — Ht 63.0 in | Wt 156.0 lb

## 2021-01-22 DIAGNOSIS — Z1211 Encounter for screening for malignant neoplasm of colon: Secondary | ICD-10-CM

## 2021-01-22 NOTE — Progress Notes (Signed)
No egg or soy allergy known to patient  No issues known to pt with past sedation with any surgeries or procedures Patient denies ever being told they had issues or difficulty with intubation  No FH of Malignant Hyperthermia Pt is not on diet pills Pt is not on home 02  Pt is not on blood thinners  Pt denies issues with constipation at this time; No A fib or A flutter Pt is fully vaccinated for Covid x 2;  Due to the COVID-19 pandemic we are asking patients to follow certain guidelines in [place] and the [place]   Pt aware of COVID protocols and [place] guidelines   Patient reports she already has her prep that was given to her when she came in for her office visit-no RX sent today

## 2021-02-05 ENCOUNTER — Ambulatory Visit (AMBULATORY_SURGERY_CENTER): Payer: Medicaid Other | Admitting: Internal Medicine

## 2021-02-05 ENCOUNTER — Encounter: Payer: Self-pay | Admitting: Internal Medicine

## 2021-02-05 VITALS — BP 108/61 | HR 72 | Temp 97.1°F | Resp 14 | Ht 63.0 in | Wt 156.0 lb

## 2021-02-05 DIAGNOSIS — D122 Benign neoplasm of ascending colon: Secondary | ICD-10-CM

## 2021-02-05 DIAGNOSIS — Z1211 Encounter for screening for malignant neoplasm of colon: Secondary | ICD-10-CM

## 2021-02-05 MED ORDER — SODIUM CHLORIDE 0.9 % IV SOLN: 500.0000 mL | Freq: Once | INTRAVENOUS | Status: DC

## 2021-02-05 NOTE — Progress Notes (Signed)
Pt Drowsy. VSS. To PACU, report to RN. No anesthetic complications noted.  

## 2021-02-05 NOTE — Op Note (Signed)
[facility] Patient Name: Linda Mcconnell Procedure Date: 02/05/2021 10:56 AM MRN: [id number] Endoscopist: Docia Chuck. Henrene Pastor , MD Age: 55 Referring MD:  Date of Birth: 1965-12-11 Gender: Female Account #: [id number] Procedure:                Colonoscopy with cold snare polypectomy x 1 Indications:              Screening for colorectal malignant neoplasm Medicines:                Monitored Anesthesia Care Procedure:                Pre-Anesthesia Assessment:                           - Prior to the procedure, a History and Physical                            was performed, and patient medications and                            allergies were reviewed. The patient's tolerance of                            previous anesthesia was also reviewed. The risks                            and benefits of the procedure and the sedation                            options and risks were discussed with the patient.                            All questions were answered, and informed consent                            was obtained. Prior Anticoagulants: The patient has                            taken no previous anticoagulant or antiplatelet                            agents. ASA Grade Assessment: II - A patient with                            mild systemic disease. After reviewing the risks                            and benefits, the patient was deemed in                            satisfactory condition to undergo the procedure.                           After obtaining informed consent, the colonoscope  was passed under direct vision. Throughout the                            procedure, the patient's blood pressure, pulse, and                            oxygen saturations were monitored continuously. The                            Olympus PCF-H190DL (#[id number]) Colonoscope was                            introduced through the anus and advanced to the the                             cecum, identified by appendiceal orifice and                            ileocecal valve. The ileocecal valve, appendiceal                            orifice, and rectum were photographed. The quality                            of the bowel preparation was excellent. The                            colonoscopy was performed without difficulty. The                            patient tolerated the procedure well. The bowel                            preparation used was SUPREP via split dose                            instruction. Scope In: 11:10:58 AM Scope Out: 11:28:37 AM Scope Withdrawal Time: 0 hours 14 minutes 58 seconds  Total Procedure Duration: 0 hours 17 minutes 39 seconds  Findings:                 A 4 mm polyp was found in the ascending colon. The                            polyp was removed with a cold snare. Resection and                            retrieval were complete.                           A few diverticula were found in the sigmoid colon.                           Internal hemorrhoids were found during  retroflexion. The hemorrhoids were small.                           The exam was otherwise without abnormality on                            direct and retroflexion views. Complications:            No immediate complications. Estimated blood loss:                            None. Estimated Blood Loss:     Estimated blood loss: none. Impression:               - One 4 mm polyp in the ascending colon, removed                            with a cold snare. Resected and retrieved.                           - Diverticulosis in the sigmoid colon.                           - Internal hemorrhoids.                           - The examination was otherwise normal on direct                            and retroflexion views. Recommendation:           - Repeat colonoscopy in 7 years for surveillance if                            polyp  adenomatous, otherwise 10 years.                           - Patient has a contact number available for                            emergencies. The signs and symptoms of potential                            delayed complications were discussed with the                            patient. Return to normal activities tomorrow.                            Written discharge instructions were provided to the                            patient.                           - Resume previous diet.                           -  Continue present medications.                           - Await pathology results. Docia Chuck. Henrene Pastor, MD 02/05/2021 11:38:05 AM This report has been signed electronically.

## 2021-02-05 NOTE — Patient Instructions (Signed)
Handout on polyps and diverticulosis given    YOU HAD AN ENDOSCOPIC PROCEDURE TODAY AT THE [facility]:   Refer to the procedure report that was given to you for any specific questions about what was found during the examination.  If the procedure report does not answer your questions, please call your gastroenterologist to clarify.  If you requested that your care partner not be given the details of your procedure findings, then the procedure report has been included in a sealed envelope for you to review at your convenience later.  YOU SHOULD EXPECT: Some feelings of bloating in the abdomen. Passage of more gas than usual.  Walking can help get rid of the air that was put into your GI tract during the procedure and reduce the bloating. If you had a lower endoscopy (such as a colonoscopy or flexible sigmoidoscopy) you may notice spotting of blood in your stool or on the toilet paper. If you underwent a bowel prep for your procedure, you may not have a normal bowel movement for a few days.  Please Note:  You might notice some irritation and congestion in your nose or some drainage.  This is from the oxygen used during your procedure.  There is no need for concern and it should clear up in a day or so.  SYMPTOMS TO REPORT IMMEDIATELY:   Following lower endoscopy (colonoscopy or flexible sigmoidoscopy):  Excessive amounts of blood in the stool  Significant tenderness or worsening of abdominal pains  Swelling of the abdomen that is new, acute  Fever of 100F or higher   For urgent or emergent issues, a gastroenterologist can be reached at any hour by calling [phone]. Do not use MyChart messaging for urgent concerns.    DIET:  We do recommend a small meal at first, but then you may proceed to your regular diet.  Drink plenty of fluids but you should avoid alcoholic beverages for 24 hours.  ACTIVITY:  You should plan to take it easy for the rest of today and you should NOT  DRIVE or use heavy machinery until tomorrow (because of the sedation medicines used during the test).    FOLLOW UP: Our staff will call the number listed on your records 48-72 hours following your procedure to check on you and address any questions or concerns that you may have regarding the information given to you following your procedure. If we do not reach you, we will leave a message.  We will attempt to reach you two times.  During this call, we will ask if you have developed any symptoms of COVID 19. If you develop any symptoms (ie: fever, flu-like symptoms, shortness of breath, cough etc.) before then, please call [phone].  If you test positive for Covid 19 in the 2 weeks post procedure, please call and report this information to us.    If any biopsies were taken you will be contacted by phone or by letter within the next 1-3 weeks.  Please call us at [phone] if you have not heard about the biopsies in 3 weeks.    SIGNATURES/CONFIDENTIALITY: You and/or your care partner have signed paperwork which will be entered into your electronic medical record.  These signatures attest to the fact that that the information above on your After Visit Summary has been reviewed and is understood.  Full responsibility of the confidentiality of this discharge information lies with you and/or your care-partner. 

## 2021-02-05 NOTE — Progress Notes (Signed)
VS completed by DT.   Cell phone off per pt.   Pt's states no medical or surgical changes since previsit or office visit.

## 2021-02-07 ENCOUNTER — Telehealth: Payer: Self-pay | Admitting: *Deleted

## 2021-02-07 ENCOUNTER — Telehealth: Payer: Self-pay

## 2021-02-07 NOTE — Telephone Encounter (Signed)
No answer for post procedure follow up. Left VM.

## 2021-02-07 NOTE — Telephone Encounter (Signed)
First attempt follow up call to pt, no answer. 

## 2021-02-11 ENCOUNTER — Encounter: Payer: Self-pay | Admitting: Internal Medicine

## 2021-04-18 ENCOUNTER — Ambulatory Visit (INDEPENDENT_AMBULATORY_CARE_PROVIDER_SITE_OTHER): Payer: Medicaid Other | Admitting: Family Medicine

## 2021-04-18 ENCOUNTER — Encounter: Payer: Self-pay | Admitting: Family Medicine

## 2021-04-18 VITALS — BP 98/68 | HR 89 | Temp 98.4°F | Wt 161.0 lb

## 2021-04-18 DIAGNOSIS — L918 Other hypertrophic disorders of the skin: Secondary | ICD-10-CM | POA: Diagnosis not present

## 2021-04-18 NOTE — Progress Notes (Signed)
° °  Subjective:    Patient ID: Linda Mcconnell, female    DOB: Jan 21, 1966, 55 y.o.   MRN: [id number]  HPI Here for a lesion on the right thigh that appeared years ago. Over the past few weeks it has become irritated and it itches. It has not changed in size.    Review of Systems  Constitutional: Negative.   Respiratory: Negative.    Cardiovascular: Negative.       Objective:   Physical Exam Constitutional:      Appearance: Normal appearance.  Cardiovascular:     Rate and Rhythm: Normal rate and regular rhythm.     Pulses: Normal pulses.     Heart sounds: Normal heart sounds.  Pulmonary:     Effort: Pulmonary effort is normal.     Breath sounds: Normal breath sounds.  Skin:    Comments: There is a small skin tag on the inner proximal right thigh   Neurological:     Mental Status: She is alert.          Assessment & Plan:  Skin tag. This was treated with cryotherapy. Recheck as needed.  Alysia Penna, MD

## 2021-05-12 DIAGNOSIS — Z6827 Body mass index (BMI) 27.0-27.9, adult: Secondary | ICD-10-CM | POA: Diagnosis not present

## 2021-05-12 DIAGNOSIS — Z01419 Encounter for gynecological examination (general) (routine) without abnormal findings: Secondary | ICD-10-CM | POA: Diagnosis not present

## 2021-05-12 DIAGNOSIS — Z1231 Encounter for screening mammogram for malignant neoplasm of breast: Secondary | ICD-10-CM | POA: Diagnosis not present

## 2021-06-26 ENCOUNTER — Telehealth: Payer: Self-pay

## 2021-06-26 DIAGNOSIS — K862 Cyst of pancreas: Secondary | ICD-10-CM

## 2021-06-26 NOTE — Telephone Encounter (Signed)
-----   Message from [place] sent at 05/24/2020  1:44 PM EST ----- ?MRI of the abdomen (pancreas) due 05/2021 ? ?

## 2021-06-26 NOTE — Telephone Encounter (Signed)
Spoke with patient and scheduled MRI of the abdomen for 07/02/2021 at 7:00am;  She is to arrive at [facility] at 6:45am, NPO midnight.  Patient agreed ?

## 2021-06-30 DIAGNOSIS — Z13820 Encounter for screening for osteoporosis: Secondary | ICD-10-CM | POA: Diagnosis not present

## 2021-07-02 ENCOUNTER — Other Ambulatory Visit: Payer: Self-pay

## 2021-07-02 ENCOUNTER — Ambulatory Visit (HOSPITAL_COMMUNITY)
Admission: RE | Admit: 2021-07-02 | Discharge: 2021-07-02 | Disposition: A | Discharge status: Home / Self Care | Payer: Medicaid Other | Source: Ambulatory Visit | Attending: Internal Medicine | Admitting: Internal Medicine

## 2021-07-02 ENCOUNTER — Other Ambulatory Visit: Payer: Self-pay | Admitting: Internal Medicine

## 2021-07-02 DIAGNOSIS — K862 Cyst of pancreas: Secondary | ICD-10-CM | POA: Insufficient documentation

## 2021-07-02 MED ORDER — GADOBUTROL 1 MMOL/ML IV SOLN
8.0000 mL | Freq: Once | INTRAVENOUS | Status: AC | PRN
  Administered 2021-07-02: 8 mL via INTRAVENOUS

## 2021-07-22 DIAGNOSIS — M25512 Pain in left shoulder: Secondary | ICD-10-CM | POA: Diagnosis not present

## 2021-07-22 DIAGNOSIS — M25552 Pain in left hip: Secondary | ICD-10-CM | POA: Diagnosis not present

## 2021-07-28 DIAGNOSIS — M25512 Pain in left shoulder: Secondary | ICD-10-CM | POA: Diagnosis not present

## 2021-07-28 DIAGNOSIS — M25552 Pain in left hip: Secondary | ICD-10-CM | POA: Diagnosis not present

## 2021-07-30 DIAGNOSIS — M79641 Pain in right hand: Secondary | ICD-10-CM | POA: Diagnosis not present

## 2021-07-30 DIAGNOSIS — M79642 Pain in left hand: Secondary | ICD-10-CM | POA: Diagnosis not present

## 2021-08-13 DIAGNOSIS — R29898 Other symptoms and signs involving the musculoskeletal system: Secondary | ICD-10-CM | POA: Diagnosis not present

## 2021-08-13 DIAGNOSIS — M79642 Pain in left hand: Secondary | ICD-10-CM | POA: Diagnosis not present

## 2021-08-13 DIAGNOSIS — M79641 Pain in right hand: Secondary | ICD-10-CM | POA: Diagnosis not present

## 2021-08-25 DIAGNOSIS — M79642 Pain in left hand: Secondary | ICD-10-CM | POA: Diagnosis not present

## 2021-08-25 DIAGNOSIS — M25512 Pain in left shoulder: Secondary | ICD-10-CM | POA: Diagnosis not present

## 2021-08-25 DIAGNOSIS — M79641 Pain in right hand: Secondary | ICD-10-CM | POA: Diagnosis not present

## 2021-10-13 ENCOUNTER — Telehealth: Payer: Self-pay | Admitting: Neurology

## 2021-10-13 NOTE — Telephone Encounter (Signed)
LVM and sent mychart msg informing pt of Dr. Zannie Cove schedule change, asked her to call back and r/s 7/10 appt.

## 2021-10-27 ENCOUNTER — Ambulatory Visit: Payer: Medicaid Other | Admitting: Neurology

## 2021-10-29 ENCOUNTER — Encounter: Payer: Self-pay | Admitting: *Deleted

## 2021-11-04 ENCOUNTER — Ambulatory Visit: Payer: Medicaid Other | Admitting: Diagnostic Neuroimaging

## 2021-11-04 ENCOUNTER — Telehealth: Payer: Self-pay | Admitting: Diagnostic Neuroimaging

## 2021-11-04 ENCOUNTER — Encounter: Payer: Self-pay | Admitting: Diagnostic Neuroimaging

## 2021-11-04 VITALS — BP 116/64 | HR 68 | Ht 63.0 in | Wt 170.2 lb

## 2021-11-04 DIAGNOSIS — R29898 Other symptoms and signs involving the musculoskeletal system: Secondary | ICD-10-CM | POA: Diagnosis not present

## 2021-11-04 DIAGNOSIS — Z79899 Other long term (current) drug therapy: Secondary | ICD-10-CM | POA: Diagnosis not present

## 2021-11-04 NOTE — Telephone Encounter (Signed)
[employer] medicaid Josem Kaufmann: [id number] exp. 11/04/21-12/19/21 sent to GI

## 2021-11-04 NOTE — Progress Notes (Signed)
[facility]  PATIENT: Linda Mcconnell DOB: 1965/05/29  REFERRING CLINICIAN: Nuala Alpha, MD HISTORY FROM: patient  REASON FOR VISIT: new consult    HISTORICAL  CHIEF COMPLAINT:  Chief Complaint  Patient presents with   Hand weakness    Rm 6 New Pt "tingling mainly in right hand, no history of injury    HISTORY OF PRESENT ILLNESS:   56 year old female here for evaluation of dropping things, incoordination and numbness.  For past 7 months patient has had intermittent grip strength weakness where she drops objects such as her phone, can of soda, carton of eggs.  She feels like she has good grip strength but somehow drops these things.  She has been trying to pay attention and hold onto things better recently.  Last Sunday she woke up with numbness in her right hand lasting for 10 minutes.  She has had EMG nerve conduction study which has been unremarkable of upper extremities.   REVIEW OF SYSTEMS: Full 14 system review of systems performed and negative with exception of: as per HPI.  ALLERGIES: Allergies  Allergen Reactions   Other     "cocktail for a HA", Toradol/Dexomethasone.   Dexamethasone Hives and Rash   Ketorolac Hives and Rash    HOME MEDICATIONS: No outpatient medications prior to visit.   No facility-administered medications prior to visit.    PAST MEDICAL HISTORY: Past Medical History:  Diagnosis Date   Anemia    hx of   Anxiety    hx of   Depression    hx of   Microscopic hematuria    Migraine     PAST SURGICAL HISTORY: Past Surgical History:  Procedure Laterality Date   COLONOSCOPY     DILATION AND CURETTAGE OF UTERUS  1986   lypoma     reversed tubal ligation  2008   [place]   VAGINA SURGERY  2018   "tighten the muscles in my vagina"   WISDOM TOOTH EXTRACTION      FAMILY HISTORY: Family History  Problem Relation Age of Onset   Diabetes Mother    Hypertension Mother    Alcohol abuse Mother     Depression Mother    Arthritis Father    Depression Sister    Anxiety disorder Sister    Anxiety disorder Sister    Depression Sister    Anxiety disorder Brother    Depression Brother    Anxiety disorder Brother    Depression Brother    Anxiety disorder Brother    Depression Brother    Diabetes Maternal Grandmother    Hypertension Maternal Grandmother    Diabetes Other        family hx   Colon polyps Neg Hx    Colon cancer Neg Hx    Esophageal cancer Neg Hx    Rectal cancer Neg Hx    Stomach cancer Neg Hx     SOCIAL HISTORY: Social History   Socioeconomic History   Marital status: Divorced    Spouse name: Not on file   Number of children: Not on file   Years of education: Not on file   Highest education level: Not on file  Occupational History   Not on file  Tobacco Use   Smoking status: Never   Smokeless tobacco: Never  Vaping Use   Vaping Use: Never used  Substance and Sexual Activity   Alcohol use: Yes    Alcohol/week: 2.0 standard drinks of alcohol  Types: 2 Standard drinks or equivalent per week    Comment: occ   Drug use: No   Sexual activity: Not Currently  Other Topics Concern   Not on file  Social History Narrative   Not on file   Social Determinants of Health   Financial Resource Strain: Not on file  Food Insecurity: Not on file  Transportation Needs: Not on file  Physical Activity: Not on file  Stress: Not on file  Social Connections: Not on file  Intimate Partner Violence: Not on file     PHYSICAL EXAM  GENERAL EXAM/CONSTITUTIONAL: Vitals:  Vitals:   11/04/21 1350  BP: 116/64  Pulse: 68  Weight: 170 lb 3.2 oz (77.2 kg)  Height: '5\' 3"'$  (1.6 m)   Body mass index is 30.15 kg/m. Wt Readings from Last 3 Encounters:  11/04/21 170 lb 3.2 oz (77.2 kg)  04/18/21 161 lb (73 kg)  02/05/21 156 lb (70.8 kg)   Patient is in no distress; well developed, nourished and groomed; neck is supple  CARDIOVASCULAR: Examination of carotid  arteries is normal; no carotid bruits Regular rate and rhythm, no murmurs Examination of peripheral vascular system by observation and palpation is normal  EYES: Ophthalmoscopic exam of optic discs and posterior segments is normal; no papilledema or hemorrhages No results found.  MUSCULOSKELETAL: Gait, strength, tone, movements noted in Neurologic exam below  NEUROLOGIC: MENTAL STATUS:      No data to display         awake, alert, oriented to person, place and time recent and remote memory intact normal attention and concentration language fluent, comprehension intact, naming intact fund of knowledge appropriate  CRANIAL NERVE:  2nd - no papilledema on fundoscopic exam 2nd, 3rd, 4th, 6th - pupils equal and reactive to light, visual fields full to confrontation, extraocular muscles intact, no nystagmus 5th - facial sensation symmetric 7th - facial strength symmetric 8th - hearing intact 9th - palate elevates symmetrically, uvula midline 11th - shoulder shrug symmetric 12th - tongue protrusion midline  MOTOR:  normal bulk and tone, full strength in the BUE, BLE  SENSORY:  normal and symmetric to light touch, temperature, vibration  COORDINATION:  finger-nose-finger, fine finger movements normal  REFLEXES:  deep tendon reflexes present and symmetric  GAIT/STATION:  narrow based gait     DIAGNOSTIC DATA (LABS, IMAGING, TESTING) - I reviewed patient records, labs, notes, testing and imaging myself where available.  Lab Results  Component Value Date   WBC 5.3 08/12/2016   HGB 12.6 08/12/2016   HCT 37.0 08/12/2016   MCV 93.9 08/12/2016   PLT 234 08/12/2016      Component Value Date/Time   NA 140 08/12/2016 0913   K 3.5 08/12/2016 0913   CL 105 08/12/2016 0913   CO2 24 08/12/2016 0913   GLUCOSE 113 (H) 08/12/2016 0913   GLUCOSE 102 (H) 03/31/2006 0939   BUN 13 08/12/2016 0913   CREATININE 1.16 (H) 08/12/2016 0913   CALCIUM 9.3 08/12/2016 0913   PROT  7.8 08/12/2016 0913   ALBUMIN 4.1 08/12/2016 0913   AST 18 08/12/2016 0913   ALT 13 (L) 08/12/2016 0913   ALKPHOS 72 08/12/2016 0913   BILITOT 0.7 08/12/2016 0913   GFRNONAA 54 (L) 08/12/2016 0913   GFRAA >60 08/12/2016 0913   Lab Results  Component Value Date   CHOL 175 03/31/2006   HDL 49.3 03/31/2006   LDLCALC 116 (H) 03/31/2006   TRIG 50 03/31/2006   CHOLHDL 3.5 CALC 03/31/2006  No results found for: "HGBA1C" No results found for: "VITAMINB12" Lab Results  Component Value Date   TSH 1.17 03/31/2006       ASSESSMENT AND PLAN  56 y.o. year old female here with:   Dx:  1. Hand weakness       PLAN:  INTERMITTENT HAND WEAKNESS / COORDINATION ISSUES (since 2023) - check MRI brain (rule out demyelinating dz) and labs  Orders Placed This Encounter  Procedures   MR BRAIN W WO CONTRAST   Hemoglobin A1c   Vitamin B12   Acetylcholine receptor, binding   Return for pending if symptoms worsen or fail to improve, pending test results.    Penni Bombard, MD 1/84/8592, 7:63 PM Certified in Neurology, Neurophysiology and Neuroimaging  [facility] [address] [place], [place] [address] [phone]

## 2021-11-06 DIAGNOSIS — R7303 Prediabetes: Secondary | ICD-10-CM | POA: Diagnosis not present

## 2021-11-06 DIAGNOSIS — Z1329 Encounter for screening for other suspected endocrine disorder: Secondary | ICD-10-CM | POA: Diagnosis not present

## 2021-11-07 LAB — VITAMIN B12: Vitamin B-12: 527 pg/mL (ref 232–1245)

## 2021-11-07 LAB — ACETYLCHOLINE RECEPTOR, BINDING: AChR Binding Ab, Serum: 0.05 nmol/L (ref 0.00–0.24)

## 2021-11-07 LAB — HEMOGLOBIN A1C
Est. average glucose Bld gHb Est-mCnc: 137 mg/dL
Hgb A1c MFr Bld: 6.4 % — ABNORMAL HIGH (ref 4.8–5.6)

## 2021-11-12 ENCOUNTER — Other Ambulatory Visit: Payer: Self-pay | Admitting: Diagnostic Neuroimaging

## 2021-11-12 ENCOUNTER — Ambulatory Visit
Admission: RE | Admit: 2021-11-12 | Discharge: 2021-11-12 | Disposition: A | Discharge status: Home / Self Care | Payer: Medicaid Other | Source: Ambulatory Visit | Attending: Diagnostic Neuroimaging | Admitting: Diagnostic Neuroimaging

## 2021-11-12 DIAGNOSIS — R29898 Other symptoms and signs involving the musculoskeletal system: Secondary | ICD-10-CM

## 2021-11-12 MED ORDER — GADOBENATE DIMEGLUMINE 529 MG/ML IV SOLN: 15.0000 mL | Freq: Once | INTRAVENOUS | Status: DC | PRN

## 2021-12-25 ENCOUNTER — Telehealth: Payer: Self-pay

## 2021-12-25 NOTE — Telephone Encounter (Signed)
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  Needs PCP follow-up.   -[facility]

## 2021-12-25 NOTE — Telephone Encounter (Signed)
Called pt LVM to discuss results and call back.

## 2021-12-29 NOTE — Telephone Encounter (Signed)
Pt called back, results relayed by phone staff

## 2021-12-29 NOTE — Telephone Encounter (Signed)
Pt returned phone call.  Message the nurse, nurse Marcille Blanco) instructed to relay results.  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  Needs PCP follow-up  everything else ok   Pt verbalized understand

## 2022-05-19 DIAGNOSIS — R635 Abnormal weight gain: Secondary | ICD-10-CM | POA: Diagnosis not present

## 2022-07-09 ENCOUNTER — Telehealth: Payer: Self-pay

## 2022-07-09 ENCOUNTER — Other Ambulatory Visit: Payer: Self-pay

## 2022-07-09 DIAGNOSIS — K862 Cyst of pancreas: Secondary | ICD-10-CM

## 2022-07-09 NOTE — Telephone Encounter (Signed)
-----   Message from Algernon Huxley, RN sent at 07/02/2021  9:31 AM EDT ----- Regarding: MRI-pancreatic lesion Needs repeat MRI

## 2022-07-09 NOTE — Telephone Encounter (Signed)
Check to see if exam scheduled.

## 2022-07-22 ENCOUNTER — Other Ambulatory Visit: Payer: Self-pay | Admitting: Internal Medicine

## 2022-07-22 ENCOUNTER — Ambulatory Visit (HOSPITAL_COMMUNITY)
Admission: RE | Admit: 2022-07-22 | Discharge: 2022-07-22 | Disposition: A | Discharge status: Home / Self Care | Payer: Medicaid Other | Source: Ambulatory Visit | Attending: Internal Medicine | Admitting: Internal Medicine

## 2022-07-22 DIAGNOSIS — K862 Cyst of pancreas: Secondary | ICD-10-CM

## 2022-07-22 DIAGNOSIS — K8689 Other specified diseases of pancreas: Secondary | ICD-10-CM | POA: Diagnosis not present

## 2022-12-15 ENCOUNTER — Ambulatory Visit: Payer: Medicaid Other | Admitting: Family Medicine

## 2022-12-18 ENCOUNTER — Ambulatory Visit (INDEPENDENT_AMBULATORY_CARE_PROVIDER_SITE_OTHER): Payer: Medicaid Other | Admitting: Family Medicine

## 2022-12-18 ENCOUNTER — Encounter: Payer: Self-pay | Admitting: Family Medicine

## 2022-12-18 VITALS — BP 118/60 | HR 75 | Temp 98.0°F | Wt 167.4 lb

## 2022-12-18 DIAGNOSIS — M7711 Lateral epicondylitis, right elbow: Secondary | ICD-10-CM

## 2022-12-18 DIAGNOSIS — L600 Ingrowing nail: Secondary | ICD-10-CM | POA: Diagnosis not present

## 2022-12-18 NOTE — Progress Notes (Signed)
   Subjective:    Patient ID: Linda Mcconnell, female    DOB: [birth year], 57 y.o.   MRN: [id number]  HPI Here for 2 issues. First she has had painful ingrown nails on the little toes of both feet for about a year. She now wants them removed. Also about 2 weeks ago she developed a pain in the right arm that is centered around the elbow but which runs up and down the arm. No recent trauma.    Review of Systems  Constitutional: Negative.   Respiratory: Negative.    Cardiovascular: Negative.   Musculoskeletal:  Positive for arthralgias.       Objective:   Physical Exam Constitutional:      Appearance: Normal appearance.  Cardiovascular:     Rate and Rhythm: Normal rate and regular rhythm.     Pulses: Normal pulses.     Heart sounds: Normal heart sounds.  Pulmonary:     Effort: Pulmonary effort is normal.     Breath sounds: Normal breath sounds.  Musculoskeletal:     Comments: The nails on both 5th toes are ingrown and tender. She is also tender over the right lateral epicondyle. No swelling. ROM is full   Neurological:     Mental Status: She is alert.           Assessment & Plan:  For the ingrown toenails, we will refer her to Podiatry. She also has tennis elbow. We will treat this with rest, ice packs, and Voltaren gel. Gershon Crane, MD

## 2022-12-31 ENCOUNTER — Ambulatory Visit (INDEPENDENT_AMBULATORY_CARE_PROVIDER_SITE_OTHER): Payer: Medicaid Other | Admitting: Podiatry

## 2022-12-31 DIAGNOSIS — M898X7 Other specified disorders of bone, ankle and foot: Secondary | ICD-10-CM | POA: Diagnosis not present

## 2023-01-03 NOTE — Progress Notes (Signed)
Subjective:  Patient ID: Linda Mcconnell, female    DOB: 10/11/1965,  MRN: [id number] HPI she presents today chief complaint of pain to the fifth toes bilaterally.  She states it feels like there is an ingrown as she refers to the margin pointing at it. No chief complaint on file.   57 y.o. female presents with the above complaint.   ROS: Denies fever chills nausea right muscle aches and pains.  Past Medical History:  Diagnosis Date   Anemia    hx of   Anxiety    hx of   Depression    hx of   Microscopic hematuria    Migraine    Past Surgical History:  Procedure Laterality Date   COLONOSCOPY     DILATION AND CURETTAGE OF UTERUS  1986   lypoma     reversed tubal ligation  2008   TUBAL LIGATION  1990   VAGINA SURGERY  2018   "tighten the muscles in my vagina"   WISDOM TOOTH EXTRACTION     No current outpatient medications on file.  Allergies  Allergen Reactions   Other     "cocktail for a HA", Toradol/Dexomethasone.   Dexamethasone Hives and Rash   Ketorolac Hives and Rash   Review of Systems Objective:  There were no vitals filed for this visit.  General: Well developed, nourished, in no acute distress, alert and oriented x3   Dermatological: Skin is warm, dry and supple bilateral. Nails x 10 are well maintained; remaining integument appears unremarkable at this time. There are no open sores, no preulcerative lesions, no rash or signs of infection present.  Margin of the left fifth toe lateral does demonstrate a listers corn with a palpable exostosis left greater than right.  No open lesions or wounds.  Vascular: Dorsalis Pedis artery and Posterior Tibial artery pedal pulses are 2/4 bilateral with immedate capillary fill time. Pedal hair growth present. No varicosities and no lower extremity edema present bilateral.   Neruologic: Grossly intact via light touch bilateral. Vibratory intact via tuning fork bilateral. Protective threshold with Semmes Wienstein  monofilament intact to all pedal sites bilateral. Patellar and Achilles deep tendon reflexes 2+ bilateral. No Babinski or clonus noted bilateral.   Musculoskeletal: No gross boney pedal deformities bilateral. No pain, crepitus, or limitation noted with foot and ankle range of motion bilateral. Muscular strength 5/5 in all groups tested bilateral.  Mild adductovarus rotated hammertoe deformity resulting in excess ptosis and painful ingrown nail.   Gait: Unassisted, Nonantalgic.    Radiographs:  None taken  Assessment & Plan:   Assessment: Adductovarus rotated hammertoe deformity ingrown toenail fibular border and exostosis lateral distal phalanx fifth digit bilateral left greater than right  Plan: Discussed etiology pathology conservative surgical therapies at this point we did discussed surgical intervention consisting of exostectomy fifth digit bilateral lateral aspect with partial nail excision.  She understands this and is amenable to it she would like to consider doing this sometime in December or January at which time she will return for consult.     Taino Maertens T. [place], [place]

## 2023-01-05 DIAGNOSIS — Z6828 Body mass index (BMI) 28.0-28.9, adult: Secondary | ICD-10-CM | POA: Diagnosis not present

## 2023-01-05 DIAGNOSIS — Z713 Dietary counseling and surveillance: Secondary | ICD-10-CM | POA: Diagnosis not present

## 2023-02-03 ENCOUNTER — Encounter: Payer: Self-pay | Admitting: Family Medicine

## 2023-02-03 ENCOUNTER — Ambulatory Visit (INDEPENDENT_AMBULATORY_CARE_PROVIDER_SITE_OTHER): Payer: Medicaid Other | Admitting: Family Medicine

## 2023-02-03 VITALS — BP 110/76 | HR 78 | Temp 98.7°F | Wt 165.0 lb

## 2023-02-03 DIAGNOSIS — M79621 Pain in right upper arm: Secondary | ICD-10-CM

## 2023-02-03 DIAGNOSIS — S4352XA Sprain of left acromioclavicular joint, initial encounter: Secondary | ICD-10-CM

## 2023-02-03 NOTE — Progress Notes (Signed)
   Subjective:    Patient ID: Marin Roberts, female    DOB: December 26, 1965, 57 y.o.   MRN: [id number]  HPI Here for 2 issues. First she slipped on some wet grass in her yard about 3 weeks ago and her feet shot out in front of her. She extended both arms behind her to catch herself. As she landed she felt a sudden sharp pain in the left shoulder. The pain has persisted since then, even though she is applying Voltaren gel and taking Ibuprofen. The other issue os a dull achy pain in the right upper arm that started about 4 months ago. This was not related to any trauma.    Review of Systems  Constitutional: Negative.   Respiratory: Negative.    Cardiovascular: Negative.   Musculoskeletal:  Positive for arthralgias.       Objective:   Physical Exam Constitutional:      General: She is not in acute distress.    Appearance: Normal appearance.  Cardiovascular:     Rate and Rhythm: Normal rate and regular rhythm.     Pulses: Normal pulses.     Heart sounds: Normal heart sounds.  Pulmonary:     Effort: Pulmonary effort is normal.     Breath sounds: Normal breath sounds.  Musculoskeletal:     Comments: Right upper arm is normal on exam. The left anterior shoulder has some swelling, and she is quite tender over the left AC joint. This seems to be stable. The left shoulder has full ROM  Neurological:     Mental Status: She is alert.           Assessment & Plan:  She has a left AC strain and some right upper arm pain of uncertain etiology. She will continue using Voltaren and Ibuprofen. Refer to Sports Medicine for both issues.  Gershon Crane, MD

## 2023-02-09 NOTE — Progress Notes (Unsigned)
   Rubin Payor, PhD, LAT, ATC acting as a scribe for [name], MD.  Linda Mcconnell is a 57 y.o. female who presents to [facility] at [facility] today for L shoulder pain ongoing for about a month. She slipped on some wet grass in her yard, extending both arms behind her, to catch herself. She locates pain to ***  Aggravates: Treatments tried:  Pt also c/o a dull achy pain in her R upper arm ongoing for about 4 months.  Pertinent review of systems: ***  Relevant historical information: ***   Exam:  There were no vitals taken for this visit. General: Well Developed, well nourished, and in no acute distress.   MSK: ***    Lab and Radiology Results No results found for this or any previous visit (from the past 72 hour(s)). No results found.     Assessment and Plan: 57 y.o. female with ***   PDMP not reviewed this encounter. No orders of the defined types were placed in this encounter.  No orders of the defined types were placed in this encounter.    Discussed warning signs or symptoms. Please see discharge instructions. Patient expresses understanding.   ***

## 2023-02-10 ENCOUNTER — Other Ambulatory Visit: Payer: Self-pay

## 2023-02-10 ENCOUNTER — Encounter: Payer: Self-pay | Admitting: Family Medicine

## 2023-02-10 ENCOUNTER — Ambulatory Visit (INDEPENDENT_AMBULATORY_CARE_PROVIDER_SITE_OTHER): Payer: Medicaid Other | Admitting: Family Medicine

## 2023-02-10 ENCOUNTER — Ambulatory Visit (INDEPENDENT_AMBULATORY_CARE_PROVIDER_SITE_OTHER): Payer: Medicaid Other

## 2023-02-10 VITALS — BP 122/82 | HR 69 | Ht 63.0 in | Wt 167.0 lb

## 2023-02-10 DIAGNOSIS — G8929 Other chronic pain: Secondary | ICD-10-CM

## 2023-02-10 DIAGNOSIS — M25521 Pain in right elbow: Secondary | ICD-10-CM | POA: Diagnosis not present

## 2023-02-10 DIAGNOSIS — M25512 Pain in left shoulder: Secondary | ICD-10-CM | POA: Diagnosis not present

## 2023-02-10 NOTE — Patient Instructions (Addendum)
Thank you for coming in today.   I've referred you to Physical Therapy.  Let us know if you don't hear from them in one week.   Please get an Xray today before you leave   You received an injection today. Seek immediate medical attention if the joint becomes red, extremely painful, or is oozing fluid.   Counter force elbow brace  Check back in 6 weeks

## 2023-03-08 NOTE — Progress Notes (Signed)
Right elbow x-ray looks okay.

## 2023-03-08 NOTE — Progress Notes (Signed)
Left shoulder x-ray shows no fractures.  You have a little bit of arthritis at the small joint at the top of the shoulder.

## 2023-03-10 ENCOUNTER — Ambulatory Visit: Payer: Medicaid Other | Attending: Family Medicine

## 2023-03-24 ENCOUNTER — Ambulatory Visit: Payer: Medicaid Other | Admitting: Family Medicine

## 2023-04-02 NOTE — Progress Notes (Unsigned)
   Rubin Payor, PhD, LAT, ATC acting as a scribe for [name], MD.  Linda Mcconnell is a 57 y.o. female who presents to [facility] today for f/u L shoulder and R elbow pain. Pt was last seen by Dr. Denyse Amass on 02/10/23 and was advised to use a compression sleeve and Voltaren gel. She was also referred to PT, but never scheduled any visits.   Today, pt reports ***  Dx imaging: 02/10/23 R elbow & L shoulder XR  Pertinent review of systems: ***  Relevant historical information: ***   Exam:  LMP 02/19/2020 (Approximate)  General: Well Developed, well nourished, and in no acute distress.   MSK: ***    Lab and Radiology Results No results found for this or any previous visit (from the past 72 hours). No results found.     Assessment and Plan: 57 y.o. female with ***   PDMP not reviewed this encounter. No orders of the defined types were placed in this encounter.  No orders of the defined types were placed in this encounter.    Discussed warning signs or symptoms. Please see discharge instructions. Patient expresses understanding.   ***

## 2023-04-05 ENCOUNTER — Ambulatory Visit: Payer: Medicaid Other | Admitting: Family Medicine

## 2023-07-07 DIAGNOSIS — Z6829 Body mass index (BMI) 29.0-29.9, adult: Secondary | ICD-10-CM | POA: Diagnosis not present

## 2023-07-07 DIAGNOSIS — R635 Abnormal weight gain: Secondary | ICD-10-CM | POA: Diagnosis not present

## 2023-07-07 DIAGNOSIS — R7309 Other abnormal glucose: Secondary | ICD-10-CM | POA: Diagnosis not present

## 2023-07-07 DIAGNOSIS — E669 Obesity, unspecified: Secondary | ICD-10-CM | POA: Diagnosis not present

## 2023-07-08 DIAGNOSIS — E785 Hyperlipidemia, unspecified: Secondary | ICD-10-CM | POA: Diagnosis not present

## 2023-07-28 ENCOUNTER — Other Ambulatory Visit: Payer: Self-pay

## 2023-07-28 DIAGNOSIS — K862 Cyst of pancreas: Secondary | ICD-10-CM

## 2023-10-05 DIAGNOSIS — R519 Headache, unspecified: Secondary | ICD-10-CM | POA: Diagnosis not present

## 2023-10-05 DIAGNOSIS — R42 Dizziness and giddiness: Secondary | ICD-10-CM | POA: Diagnosis not present

## 2023-10-05 DIAGNOSIS — R232 Flushing: Secondary | ICD-10-CM | POA: Diagnosis not present

## 2023-10-05 DIAGNOSIS — R7309 Other abnormal glucose: Secondary | ICD-10-CM | POA: Diagnosis not present

## 2023-10-05 DIAGNOSIS — R5383 Other fatigue: Secondary | ICD-10-CM | POA: Diagnosis not present

## 2023-10-05 DIAGNOSIS — G4709 Other insomnia: Secondary | ICD-10-CM | POA: Diagnosis not present

## 2023-12-09 ENCOUNTER — Encounter: Payer: Self-pay | Admitting: Adult Health

## 2023-12-09 ENCOUNTER — Ambulatory Visit (INDEPENDENT_AMBULATORY_CARE_PROVIDER_SITE_OTHER): Admitting: Adult Health

## 2023-12-09 VITALS — BP 110/78 | HR 84 | Temp 97.8°F | Ht 63.0 in | Wt 173.0 lb

## 2023-12-09 DIAGNOSIS — L247 Irritant contact dermatitis due to plants, except food: Secondary | ICD-10-CM | POA: Diagnosis not present

## 2023-12-09 MED ORDER — TRIAMCINOLONE ACETONIDE 0.1 % EX CREA
1.0000 | TOPICAL_CREAM | Freq: Two times a day (BID) | CUTANEOUS | 2 refills | Status: AC
Start: 2023-12-09 — End: ?

## 2023-12-09 NOTE — Progress Notes (Signed)
 Subjective:    Patient ID: Linda Mcconnell, female    DOB: [birth year], 58 y.o.   MRN: [id number]  Rash   58 year old female who  has a past medical history of Anemia, Anxiety, Depression, Microscopic hematuria, and Migraine.  She presents to the office today for an acute issue. She reports that she was working outside yesterday and was trimming bushes when she developed a red rash on her left upper arm. She reports that it does not itch but can burn in the shower. At home she has been using neosporin. She has not had any trouble breathing or swallowing    Review of Systems  Skin:  Positive for rash.   See HPI   Past Medical History:  Diagnosis Date   Anemia    hx of   Anxiety    hx of   Depression    hx of   Microscopic hematuria    Migraine     Social History   Socioeconomic History   Marital status: Divorced    Spouse name: Not on file   Number of children: Not on file   Years of education: Not on file   Highest education level: Not on file  Occupational History   Not on file  Tobacco Use   Smoking status: Never   Smokeless tobacco: Never  Vaping Use   Vaping status: Never Used  Substance and Sexual Activity   Alcohol use: Yes    Alcohol/week: 2.0 standard drinks of alcohol    Types: 2 Standard drinks or equivalent per week    Comment: occ   Drug use: No   Sexual activity: Not Currently  Other Topics Concern   Not on file  Social History Narrative   Not on file   Social Drivers of Health   Financial Resource Strain: Not on file  Food Insecurity: Not on file  Transportation Needs: Not on file  Physical Activity: Not on file  Stress: Not on file  Social Connections: Not on file  Intimate Partner Violence: Not on file    Past Surgical History:  Procedure Laterality Date   COLONOSCOPY     DILATION AND CURETTAGE OF UTERUS  1986   lypoma     reversed tubal ligation  2008   TUBAL LIGATION  1990   VAGINA SURGERY  2018   tighten the muscles in my  vagina   WISDOM TOOTH EXTRACTION      Family History  Problem Relation Age of Onset   Diabetes Mother    Hypertension Mother    Alcohol abuse Mother    Depression Mother    Arthritis Father    Depression Sister    Anxiety disorder Sister    Anxiety disorder Sister    Depression Sister    Anxiety disorder Brother    Depression Brother    Anxiety disorder Brother    Depression Brother    Anxiety disorder Brother    Depression Brother    Diabetes Maternal Grandmother    Hypertension Maternal Grandmother    Diabetes Other        family hx   Colon polyps Neg Hx    Colon cancer Neg Hx    Esophageal cancer Neg Hx    Rectal cancer Neg Hx    Stomach cancer Neg Hx     Allergies  Allergen Reactions   Other     cocktail for a HA, Toradol /Dexomethasone.   Dexamethasone  Hives and Rash   Ketorolac  Hives  and Rash    Current Outpatient Medications on File Prior to Visit  Medication Sig Dispense Refill   Diethylpropion HCl CR 75 MG TB24 Take 1 tablet by mouth daily.     phentermine (ADIPEX-P) 37.5 MG tablet Take 1 tablet by mouth daily.     No current facility-administered medications on file prior to visit.    BP 110/78   Pulse 84   Temp 97.8 F (36.6 C) (Oral)   Ht 5' 3 (1.6 m)   Wt 173 lb (78.5 kg)   LMP 02/19/2020 (Approximate)   SpO2 97%   BMI 30.65 kg/m       Objective:   Physical Exam Vitals and nursing note reviewed.  Constitutional:      Appearance: Normal appearance.  Musculoskeletal:        General: Normal range of motion.  Skin:    General: Skin is warm and dry.     Findings: Erythema present.     Comments: Redness noted to let upper arm. No vesicles or pustules noted.   Neurological:     General: No focal deficit present.     Mental Status: She is alert and oriented to person, place, and time.  Psychiatric:        Mood and Affect: Mood normal.        Behavior: Behavior normal.        Thought Content: Thought content normal.         Judgment: Judgment normal.       Assessment & Plan:  1. Irritant contact dermatitis due to plants, except food (Primary) - Will prescribe topical steroid cream. She can also use Zyrtec or claritin daily  - Follow up if not resolving  - triamcinolone  cream (KENALOG ) 0.1 %; Apply 1 Application topically 2 (two) times daily.  Dispense: 30 g; Refill: 2  Shaan Rhoads, NP

## 2024-01-12 DIAGNOSIS — Z124 Encounter for screening for malignant neoplasm of cervix: Secondary | ICD-10-CM | POA: Diagnosis not present

## 2024-01-12 DIAGNOSIS — R7309 Other abnormal glucose: Secondary | ICD-10-CM | POA: Diagnosis not present

## 2024-01-12 DIAGNOSIS — Z0000 Encounter for general adult medical examination without abnormal findings: Secondary | ICD-10-CM | POA: Diagnosis not present

## 2024-01-12 DIAGNOSIS — Z1329 Encounter for screening for other suspected endocrine disorder: Secondary | ICD-10-CM | POA: Diagnosis not present

## 2024-01-12 DIAGNOSIS — Z1231 Encounter for screening mammogram for malignant neoplasm of breast: Secondary | ICD-10-CM | POA: Diagnosis not present

## 2024-01-12 DIAGNOSIS — E7800 Pure hypercholesterolemia, unspecified: Secondary | ICD-10-CM | POA: Diagnosis not present

## 2024-01-12 DIAGNOSIS — Z6829 Body mass index (BMI) 29.0-29.9, adult: Secondary | ICD-10-CM | POA: Diagnosis not present

## 2024-01-12 DIAGNOSIS — Z13820 Encounter for screening for osteoporosis: Secondary | ICD-10-CM | POA: Diagnosis not present

## 2024-04-26 ENCOUNTER — Ambulatory Visit

## 2024-04-28 NOTE — Progress Notes (Signed)
 Error
